# Patient Record
Sex: Female | Born: 1935 | Race: White | Hispanic: No | Smoking: Never smoker
Health system: Southern US, Community
[De-identification: ages and names within clinical notes are randomized; demographics above are authoritative.]

## PROBLEM LIST (undated history)

## (undated) DIAGNOSIS — N2889 Other specified disorders of kidney and ureter: Secondary | ICD-10-CM

## (undated) DIAGNOSIS — R413 Other amnesia: Secondary | ICD-10-CM

## (undated) DIAGNOSIS — H269 Unspecified cataract: Secondary | ICD-10-CM

## (undated) DIAGNOSIS — H6590 Unspecified nonsuppurative otitis media, unspecified ear: Secondary | ICD-10-CM

## (undated) DIAGNOSIS — I739 Peripheral vascular disease, unspecified: Secondary | ICD-10-CM

## (undated) DIAGNOSIS — M1990 Unspecified osteoarthritis, unspecified site: Secondary | ICD-10-CM

## (undated) DIAGNOSIS — E119 Type 2 diabetes mellitus without complications: Secondary | ICD-10-CM

## (undated) DIAGNOSIS — K769 Liver disease, unspecified: Secondary | ICD-10-CM

## (undated) DIAGNOSIS — E785 Hyperlipidemia, unspecified: Secondary | ICD-10-CM

## (undated) DIAGNOSIS — H9110 Presbycusis, unspecified ear: Secondary | ICD-10-CM

## (undated) DIAGNOSIS — R634 Abnormal weight loss: Secondary | ICD-10-CM

## (undated) DIAGNOSIS — R066 Hiccough: Secondary | ICD-10-CM

## (undated) DIAGNOSIS — H409 Unspecified glaucoma: Secondary | ICD-10-CM

## (undated) DIAGNOSIS — E663 Overweight: Secondary | ICD-10-CM

## (undated) DIAGNOSIS — I10 Essential (primary) hypertension: Secondary | ICD-10-CM

## (undated) HISTORY — DX: Unspecified nonsuppurative otitis media, unspecified ear: H65.90

## (undated) HISTORY — PX: CHOLECYSTECTOMY: SHX55

## (undated) HISTORY — DX: Overweight: E66.3

## (undated) HISTORY — DX: Hiccough: R06.6

## (undated) HISTORY — DX: Abnormal weight loss: R63.4

## (undated) HISTORY — DX: Other amnesia: R41.3

## (undated) HISTORY — DX: Presbycusis, unspecified ear: H91.10

## (undated) HISTORY — DX: Essential (primary) hypertension: I10

## (undated) HISTORY — DX: Liver disease, unspecified: K76.9

## (undated) HISTORY — DX: Other specified disorders of kidney and ureter: N28.89

## (undated) HISTORY — DX: Unspecified osteoarthritis, unspecified site: M19.90

## (undated) HISTORY — PX: BREAST SURGERY: SHX581

## (undated) HISTORY — DX: Unspecified cataract: H26.9

## (undated) HISTORY — DX: Unspecified glaucoma: H40.9

## (undated) HISTORY — PX: CARPAL TUNNEL RELEASE: SHX101

## (undated) HISTORY — DX: Peripheral vascular disease, unspecified: I73.9

## (undated) HISTORY — DX: Type 2 diabetes mellitus without complications: E11.9

## (undated) HISTORY — DX: Hyperlipidemia, unspecified: E78.5

---

## 1998-11-15 HISTORY — PX: TOTAL NEPHRECTOMY: SHX415

## 2011-05-03 ENCOUNTER — Ambulatory Visit: Payer: Self-pay | Admitting: Internal Medicine

## 2011-05-03 ENCOUNTER — Ambulatory Visit (INDEPENDENT_AMBULATORY_CARE_PROVIDER_SITE_OTHER): Payer: Medicare Other | Admitting: Family Medicine

## 2011-05-03 ENCOUNTER — Encounter: Payer: Self-pay | Admitting: Family Medicine

## 2011-05-03 DIAGNOSIS — M129 Arthropathy, unspecified: Secondary | ICD-10-CM

## 2011-05-03 DIAGNOSIS — E663 Overweight: Secondary | ICD-10-CM

## 2011-05-03 DIAGNOSIS — E119 Type 2 diabetes mellitus without complications: Secondary | ICD-10-CM | POA: Insufficient documentation

## 2011-05-03 DIAGNOSIS — N2889 Other specified disorders of kidney and ureter: Secondary | ICD-10-CM

## 2011-05-03 DIAGNOSIS — I10 Essential (primary) hypertension: Secondary | ICD-10-CM

## 2011-05-03 DIAGNOSIS — C649 Malignant neoplasm of unspecified kidney, except renal pelvis: Secondary | ICD-10-CM | POA: Insufficient documentation

## 2011-05-03 DIAGNOSIS — R634 Abnormal weight loss: Secondary | ICD-10-CM | POA: Insufficient documentation

## 2011-05-03 DIAGNOSIS — N289 Disorder of kidney and ureter, unspecified: Secondary | ICD-10-CM

## 2011-05-03 DIAGNOSIS — H9110 Presbycusis, unspecified ear: Secondary | ICD-10-CM

## 2011-05-03 DIAGNOSIS — M1990 Unspecified osteoarthritis, unspecified site: Secondary | ICD-10-CM

## 2011-05-03 DIAGNOSIS — R413 Other amnesia: Secondary | ICD-10-CM

## 2011-05-03 HISTORY — DX: Overweight: E66.3

## 2011-05-03 HISTORY — DX: Essential (primary) hypertension: I10

## 2011-05-03 HISTORY — DX: Unspecified osteoarthritis, unspecified site: M19.90

## 2011-05-03 HISTORY — DX: Presbycusis, unspecified ear: H91.10

## 2011-05-03 HISTORY — DX: Abnormal weight loss: R63.4

## 2011-05-03 HISTORY — DX: Other specified disorders of kidney and ureter: N28.89

## 2011-05-03 MED ORDER — LISINOPRIL 5 MG PO TABS: 5.0000 mg | ORAL_TABLET | Freq: Every day | ORAL | Status: DC

## 2011-05-03 NOTE — Patient Instructions (Addendum)

## 2011-05-03 NOTE — Assessment & Plan Note (Addendum)
Neck, back and hands are most notable. Is not interested in chronic meds but may use Aspercreme and Tylenol when necessary and report worsening symptoms.

## 2011-05-05 ENCOUNTER — Encounter: Payer: Self-pay | Admitting: Family Medicine

## 2011-05-05 DIAGNOSIS — R413 Other amnesia: Secondary | ICD-10-CM | POA: Insufficient documentation

## 2011-05-05 HISTORY — DX: Other amnesia: R41.3

## 2011-05-05 NOTE — Assessment & Plan Note (Signed)
Well-controlled today's visit on just 5 mg of lisinopril so we'll continue her on her amlodipine, lisinopril at 5 mg and her atenolol 100 mg daily.

## 2011-05-05 NOTE — Assessment & Plan Note (Signed)
Patient has no concerning symptoms and has never needed any medications. Will need a hemoglobin A1c drawn up with next blood draw to further evaluate. Encouraged to avoid a car.

## 2011-05-05 NOTE — Progress Notes (Signed)
RAYHANA SLIDER [id number] 1927-09-08 05/05/2011      Progress Note New Patient  Subjective  Chief Complaint  Chief Complaint  Patient presents with  . Establish Care    HPI  Patient is an 77 year old Caucasian female in today for new patient appointment. She was referred to Dr. Cathie Hoops by her chiropractor after her previous primary care doctor Dr. Neldon Newport. She feels well and offers no acute complaints today does need to establish care. He does have new hearing aids and is having some difficulty still with her hearing but it is slightly improved. She was widowed in September 2011 and has been having some trouble with stress and difficulty with short-term memory as a result. She does believe is situational and she continues to live at home and maintain her ADLs well. She has been diagnosed with diabetes in the past but has never had to take any medications. She does take frequent cinnamon in her diet does believe that's helping control her sugars. She reports her pain sugars are often 90-110. No recent febrile illness, headache, chest pain, palpitations, shortness of breath, GI or GU complaints. Her blood pressure is well-controlled on a combination of amlodipine lisinopril it is 5 mg and atenolol daily  Past Medical History  Diagnosis Date  . HTN (hypertension) 05/03/2011  . Diabetes mellitus 05/03/2011  . Overweight 05/03/2011  . Hearing loss of aging 05/03/2011  . Renal mass, left 05/03/2011  . Arthritis 05/03/2011  . Memory loss of unknown cause 05/05/2011    Past Surgical History  Procedure Date  . Total nephrectomy 2000    left  . Carpal tunnel release     right  . Cholecystectomy   . Breast surgery     biopsy, benign    Family History  Problem Relation Age of Onset  . Hypertension Mother   . Diabetes Sister     type 1  . Diabetes Brother   . Hypertension Brother   . Kidney disease Brother   . Diabetes Maternal Grandfather   . Heart disease Paternal Grandfather   .  Heart disease Brother   . Stroke Brother     History   Social History  . Marital Status: Widowed    Spouse Name: N/A    Number of Children: N/A  . Years of Education: N/A   Occupational History  . Not on file.   Social History Main Topics  . Smoking status: Never Smoker   . Smokeless tobacco: Not on file  . Alcohol Use: No  . Drug Use: No  . Sexually Active:    Other Topics Concern  . Not on file   Social History Narrative  . No narrative on file    No current outpatient prescriptions on file prior to visit.    Allergies  Allergen Reactions  . Shrimp (Shellfish Allergy)     Unknown allergic reaction  . Sulfites     Unknown reaction    Review of Systems  Review of Systems  Constitutional: Negative for fever, chills and malaise/fatigue.  HENT: Positive for hearing loss. Negative for ear pain, nosebleeds, congestion, tinnitus and ear discharge.   Eyes: Negative for discharge.  Respiratory: Negative for cough, sputum production, shortness of breath and wheezing.   Cardiovascular: Negative for chest pain, palpitations and leg swelling.  Gastrointestinal: Negative for heartburn, nausea, vomiting, abdominal pain, diarrhea, constipation and blood in stool.  Genitourinary: Negative for dysuria, urgency, frequency and hematuria.  Musculoskeletal: Negative for myalgias, back pain and falls.  Skin: Negative for rash.  Neurological: Negative for dizziness, tremors, sensory change, focal weakness, loss of consciousness, weakness and headaches.  Endo/Heme/Allergies: Negative for polydipsia. Does not bruise/bleed easily.  Psychiatric/Behavioral: Positive for depression and memory loss. Negative for suicidal ideas. The patient is not nervous/anxious and does not have insomnia.     Objective  BP 130/70  Pulse 70  Temp(Src) 98 F (36.7 C) (Oral)  Resp 18  Ht 4' 10.5" (1.486 m)  Wt 139 lb (63.05 kg)  BMI 28.56 kg/m2  SpO2 97%  Physical Exam  Physical Exam    Constitutional: She is oriented to person, place, and time and well-developed, well-nourished, and in no distress. No distress.  HENT:  Head: Normocephalic and atraumatic.  Right Ear: External ear normal.  Left Ear: External ear normal.  Nose: Nose normal.  Mouth/Throat: Oropharynx is clear and moist. No oropharyngeal exudate.  Eyes: Conjunctivae are normal. Pupils are equal, round, and reactive to light. Right eye exhibits no discharge. Left eye exhibits no discharge. No scleral icterus.  Neck: Normal range of motion. Neck supple. No thyromegaly present.  Cardiovascular: Normal rate, regular rhythm, normal heart sounds and intact distal pulses.   Pulmonary/Chest: Effort normal and breath sounds normal. No respiratory distress. She has no wheezes. She has no rales.  Abdominal: Soft. Bowel sounds are normal. She exhibits no distension and no mass. There is no tenderness.  Musculoskeletal: Normal range of motion. She exhibits no edema and no tenderness.  Lymphadenopathy:    She has no cervical adenopathy.  Neurological: She is alert and oriented to person, place, and time. She has normal reflexes. No cranial nerve deficit. Coordination normal.  Skin: Skin is warm and dry. No rash noted. She is not diaphoretic.  Psychiatric: Mood, memory and affect normal.       Assessment & Plan  Arthritis Neck, back and hands are most notable. Is not interested in chronic meds but may use Aspercreme and Tylenol when necessary and report worsening symptoms.  Diabetes mellitus Patient has no concerning symptoms and has never needed any medications. Will need a hemoglobin A1c drawn up with next blood draw to further evaluate. Encouraged to avoid a car.  HTN (hypertension) Well-controlled today's visit on just 5 mg of lisinopril so we'll continue her on her amlodipine, lisinopril at 5 mg and her atenolol 100 mg daily.  Hearing loss of aging Has gotten new hearing aides recently but continues to have  some difficulties  Renal mass, left We will have her renal functions checked and obtain old records to further evaluate her situation  Memory loss of unknown cause Patient has been widowed this year, notes some difficulty with her memory lately but acknowledges she has been very overwhelmed by the whole process. Is still living at home and able to take care of her ADLs. We will have her come back for reassessment.

## 2011-05-05 NOTE — Assessment & Plan Note (Signed)
We will have her renal functions checked and obtain old records to further evaluate her situation

## 2011-05-05 NOTE — Assessment & Plan Note (Signed)
Patient has been widowed this year, notes some difficulty with her memory lately but acknowledges she has been very overwhelmed by the whole process. Is still living at home and able to take care of her ADLs. We will have her come back for reassessment.

## 2011-05-05 NOTE — Assessment & Plan Note (Signed)
Has gotten new hearing aides recently but continues to have some difficulties

## 2011-05-28 ENCOUNTER — Telehealth: Payer: Self-pay | Admitting: Family Medicine

## 2011-05-28 NOTE — Telephone Encounter (Signed)
Forwarded to Dr. Abner Greenspan for review. No upcoming appointments with Cardiology;however, another set of these records were copied and are at the [facility] front desk. Call ext [phone] if these copies are needed.

## 2011-06-29 ENCOUNTER — Telehealth: Payer: Self-pay | Admitting: Family Medicine

## 2011-06-29 NOTE — Telephone Encounter (Signed)
Patient wants to know if you have all the records you need from Dr Brendolyn Patty at [facility], she requested them earlier this year

## 2011-06-30 NOTE — Telephone Encounter (Signed)
Have reviewed the chart and do not see any records from Dr Brendolyn Patty. Can you confirm? If no can you have the front desk request them again? They may be sitting at [facility] but I do not know

## 2011-07-01 NOTE — Telephone Encounter (Addendum)
Records have been scanned into EPIC.  Records are under media, 2nd item, "7/97-5/12".  You have to turn OFF "default filter" to see these records.

## 2011-07-01 NOTE — Telephone Encounter (Signed)
Please notify patient we have gotten her records

## 2011-07-01 NOTE — Telephone Encounter (Signed)
19 minutes spent with patient.   1-advised pt we have records from [facility] and [facility].  Pt wants [place] to have last kidney function test from 4/12 performed at [facility].  I advised pt we have many records from them and we would review them. 2-pt is overdue for follow up.  Scheduled pt for an office visit with Dr. Abner Greenspan for 07/20/11.  Pt will come the week before for labs.  I will call pt after Dr. Abner Greenspan review if she needs to be fasting. Dr. Abner Greenspan, please advise which labs patient needs at this visit.  Per 05/03/11 note she needs A1C.  Does she need anything else? 3-Several minutes spent with pt explaining that if she wishes to continue seeing Dr. Abner Greenspan she will need to be seen at the [facility] office.  Dr. Abner Greenspan saw this patient at the [facility] office while filling in for Dr. Artist Pais.  Pt is agreeable with this.  It took several minutes for the patient to understand what office she needed to come to.  I advised pt I would mail her a map to our office and a printout of her upcoming appointment.  Pt aware Dr. Abner Greenspan out of the office until Monday. Dr. Abner Greenspan, please advise the highlighted item above.

## 2011-07-01 NOTE — Telephone Encounter (Signed)
I have attempted to contact this patient by phone with the following results: left message to return my call on answering machine (home).  

## 2011-07-04 NOTE — Telephone Encounter (Signed)
It will have been 4 months since her last blood work would do a hgba1c, flp, liver, renal, cbc

## 2011-07-05 NOTE — Telephone Encounter (Signed)
Added to appointment.

## 2011-07-13 ENCOUNTER — Other Ambulatory Visit (INDEPENDENT_AMBULATORY_CARE_PROVIDER_SITE_OTHER): Payer: Medicare Other

## 2011-07-13 DIAGNOSIS — E119 Type 2 diabetes mellitus without complications: Secondary | ICD-10-CM

## 2011-07-13 DIAGNOSIS — E785 Hyperlipidemia, unspecified: Secondary | ICD-10-CM

## 2011-07-13 DIAGNOSIS — I10 Essential (primary) hypertension: Secondary | ICD-10-CM

## 2011-07-13 LAB — CBC WITH DIFFERENTIAL/PLATELET
Basophils Relative: 0.6 % (ref 0.0–3.0)
Eosinophils Relative: 1.4 % (ref 0.0–5.0)
Hemoglobin: 14.1 g/dL (ref 12.0–15.0)
Lymphocytes Relative: 17.7 % (ref 12.0–46.0)
Monocytes Relative: 6.9 % (ref 3.0–12.0)
Neutro Abs: 7.3 10*3/uL (ref 1.4–7.7)
RBC: 4.68 Mil/uL (ref 3.87–5.11)
WBC: 9.9 10*3/uL (ref 4.5–10.5)

## 2011-07-13 LAB — RENAL FUNCTION PANEL
Albumin: 4 g/dL (ref 3.5–5.2)
BUN: 12 mg/dL (ref 6–23)
CO2: 29 mEq/L (ref 19–32)
Chloride: 104 mEq/L (ref 96–112)
Potassium: 4.4 mEq/L (ref 3.5–5.1)

## 2011-07-13 LAB — LIPID PANEL
Total CHOL/HDL Ratio: 4
VLDL: 18.2 mg/dL (ref 0.0–40.0)

## 2011-07-13 LAB — LDL CHOLESTEROL, DIRECT: Direct LDL: 166.9 mg/dL

## 2011-07-13 LAB — HEPATIC FUNCTION PANEL
AST: 20 U/L (ref 0–37)
Albumin: 4 g/dL (ref 3.5–5.2)
Alkaline Phosphatase: 67 U/L (ref 39–117)
Bilirubin, Direct: 0 mg/dL (ref 0.0–0.3)
Total Bilirubin: 0.4 mg/dL (ref 0.3–1.2)

## 2011-07-20 ENCOUNTER — Encounter: Payer: Self-pay | Admitting: Family Medicine

## 2011-07-20 ENCOUNTER — Ambulatory Visit (INDEPENDENT_AMBULATORY_CARE_PROVIDER_SITE_OTHER): Payer: Medicare Other | Admitting: Family Medicine

## 2011-07-20 DIAGNOSIS — I10 Essential (primary) hypertension: Secondary | ICD-10-CM

## 2011-07-20 DIAGNOSIS — E785 Hyperlipidemia, unspecified: Secondary | ICD-10-CM

## 2011-07-20 DIAGNOSIS — E119 Type 2 diabetes mellitus without complications: Secondary | ICD-10-CM

## 2011-07-20 HISTORY — DX: Hyperlipidemia, unspecified: E78.5

## 2011-07-20 LAB — NMR LIPOPROFILE WITHOUT LIPIDS: Small LDL Particle Number: 154 nmol/L (ref <=527)

## 2011-07-20 NOTE — Patient Instructions (Signed)
Hypercholesterolemia High Blood Cholesterol Cholesterol is a white, waxy, fat-like protein needed by your body in small amounts. The liver makes all the cholesterol you need. It is carried from the liver by the blood through the blood vessels. Deposits (plaque) may build up on blood vessel walls. This makes the arteries narrower and stiffer. Plaque increases the risk for heart attack and stroke. You cannot feel your cholesterol level even if it is very high. The only way to know is by a blood test to check your lipid (fats) levels. Once you know your cholesterol levels, you should keep a record of the test results. Work with your caregiver to to keep your levels in the desired range. WHAT THE RESULTS MEAN:  Total cholesterol is a rough measure of all the cholesterol in your blood.   LDL is the so-called bad cholesterol. This is the type that deposits cholesterol in the walls of the arteries. You want this level to be low.   HDL is the good cholesterol because it cleans the arteries and carries the LDL away. You want this level to be high.   Triglycerides are fat that the body can either burn for energy or store. High levels are closely linked to heart disease.  DESIRED LEVELS:  Total cholesterol below 200.   LDL below 100 for people at risk, below 70 for very high risk.   HDL above 50 is good, above 60 is best.   Triglycerides below 150.  HOW TO LOWER YOUR CHOLESTEROL:  Diet.   Choose fish or white meat chicken and [place], roasted or baked. Limit fatty cuts of red meat, fried foods, and processed meats, such as sausage and lunch meat.   Eat lots of fresh fruits and vegetables. Choose whole grains, beans, pasta, potatoes and cereals.   Use only small amounts of olive, corn or canola oils. Avoid butter, mayonnaise, shortening or palm kernel oils. Avoid foods with trans-fats.   Use skim/nonfat milk and low-fat/nonfat yogurt and cheeses. Avoid whole milk, cream, ice cream, egg yolks and  cheeses. Healthy desserts include angel food cake, gingersnaps, animal crackers, hard candy, popsicles, and low-fat/nonfat frozen yogurt. Avoid pastries, cakes, pies and cookies.   Exercise.   A regular program helps decrease LDL and raises HDL.   Helps with weight control.   Do things that increase your activity level like gardening, walking, or taking the stairs.   Medication.   May be prescribed by your caregiver to help lowering cholesterol and the risk for heart disease.   You may need medicine even if your levels are normal if you have several risk factors.  HOME CARE INSTRUCTIONS  Follow your diet and exercise programs as suggested by your caregiver.   Take medications as directed.   Have blood work done when your caregiver feels it is necessary.  MAKE SURE YOU:   Understand these instructions.   Will watch your condition.   Will get help right away if you are not doing well or get worse.  Document Released: 11/01/2005 Document Re-Released: 10/14/2008 [facility] Patient Information 2011 [place], [place].  Consider Fish oil caps by [employer], called MegaRed 1 cap daily or increase your fish oil to 2 tsp daily

## 2011-07-20 NOTE — Assessment & Plan Note (Signed)
Patient reports her bp generally sits in the 130s to 140s when she checks it at home. Asymptomatic, she is encouraged to minimize sodium and we will recheck at next visit. Continue Lisinopril at 5 mg daily

## 2011-07-20 NOTE — Progress Notes (Signed)
Kathryn Collins [id number] [birth year] 07/20/2011      Progress Note-Follow Up  Subjective  Chief Complaint  Chief Complaint  Patient presents with  . Follow-up    1 month follow up    HPI  Patient is an 77 year old Caucasian female in today for followup on recent labs and her blood pressure. Overall she reports she is doing well. She is monitoring her blood pressure at home and generally seen systolic numbers in the 130s and 140s. She denied headaches, chest pain, palpitations. She notes occasionally her blood pressure is up but it's usually when she's been running and when she rests it improves. She denies any recent illness, fevers, chills, chest pain, shortness of breath, GI or GU complaints. She checks her sugars regularly and these numbers generally in the 90-110 range. She has trouble sleeping intermittently but she reports which he takes vitamin B12 or zinc she feels helps her to fall sleep. She is tolerating the addition of lisinopril and denies any cough  Past Medical History  Diagnosis Date  . HTN (hypertension) 05/03/2011  . Diabetes mellitus 05/03/2011  . Overweight 05/03/2011  . Hearing loss of aging 05/03/2011  . Renal mass, left 05/03/2011  . Arthritis 05/03/2011  . Memory loss of unknown cause 05/05/2011  . Hyperlipidemia 07/20/2011    Past Surgical History  Procedure Date  . Total nephrectomy 2000    left  . Carpal tunnel release     right  . Cholecystectomy   . Breast surgery     biopsy, benign    Family History  Problem Relation Age of Onset  . Hypertension Mother   . Diabetes Sister     type 1  . Diabetes Brother   . Hypertension Brother   . Kidney disease Brother   . Diabetes Maternal Grandfather   . Heart disease Paternal Grandfather   . Heart disease Brother   . Stroke Brother     History   Social History  . Marital Status: Widowed    Spouse Name: N/A    Number of Children: N/A  . Years of Education: N/A   Occupational History  . Not on  file.   Social History Main Topics  . Smoking status: Never Smoker   . Smokeless tobacco: Not on file  . Alcohol Use: No  . Drug Use: No  . Sexually Active:    Other Topics Concern  . Not on file   Social History Narrative  . No narrative on file    Current Outpatient Prescriptions on File Prior to Visit  Medication Sig Dispense Refill  . amLODipine (NORVASC) 5 MG tablet Take 5 mg by mouth daily.        Marland Kitchen atenolol (TENORMIN) 100 MG tablet Take 100 mg by mouth daily.        . Edetic Acid (EDTA) POWD 625 mg 2 (two) times daily.        Marland Kitchen GLUCOSAMINE SULFATE PO Take by mouth daily.        Marland Kitchen lisinopril (PRINIVIL,ZESTRIL) 5 MG tablet Take 1 tablet (5 mg total) by mouth daily.  30 tablet  3  . NON FORMULARY daily. Cataplex, Cataplex ACP, Betacol, Cataplex D, Cataplex B, A-F Betaford, Congaplex, okra pepsin         Allergies  Allergen Reactions  . Shrimp (Shellfish Allergy)     Unknown allergic reaction  . Sulfites     Unknown reaction    Review of Systems  Review of Systems  Constitutional: Negative  for fever and malaise/fatigue.  HENT: Negative for congestion.   Eyes: Negative for discharge.  Respiratory: Negative for shortness of breath.   Cardiovascular: Negative for chest pain, palpitations and leg swelling.  Gastrointestinal: Negative for nausea, abdominal pain, diarrhea and melena.  Genitourinary: Negative for dysuria.  Musculoskeletal: Negative for falls.  Skin: Negative for rash.  Neurological: Negative for loss of consciousness and headaches.  Endo/Heme/Allergies: Negative for polydipsia.  Psychiatric/Behavioral: Negative for depression and suicidal ideas. The patient is not nervous/anxious and does not have insomnia.     Objective  BP 148/74  Pulse 63  Temp(Src) 98 F (36.7 C) (Oral)  Ht 4' 10.5" (1.486 m)  Wt 139 lb 12.8 oz (63.413 kg)  BMI 28.72 kg/m2  SpO2 96%  Physical Exam  Physical Exam  Constitutional: She is well-developed, well-nourished,  and in no distress. No distress.  HENT:  Left Ear: External ear normal.  Mouth/Throat: No oropharyngeal exudate.  Eyes: EOM are normal. Left eye exhibits no discharge. No scleral icterus.  Neck: No JVD present. No tracheal deviation present.  Cardiovascular: Normal rate, regular rhythm, normal heart sounds and intact distal pulses.   Pulmonary/Chest: No respiratory distress. She has no rales.  Abdominal: She exhibits no distension and no mass. There is tenderness. There is no guarding.  Musculoskeletal: She exhibits no edema and no tenderness.  Lymphadenopathy:    She has no cervical adenopathy.  Skin: No rash noted. No erythema.    No results found for this basename: TSH   Lab Results  Component Value Date   WBC 9.9 07/13/2011   HGB 14.1 07/13/2011   HCT 42.7 07/13/2011   MCV 91.2 07/13/2011   PLT 271.0 07/13/2011   Lab Results  Component Value Date   CREATININE 0.9 07/13/2011   BUN 12 07/13/2011   NA 140 07/13/2011   K 4.4 07/13/2011   CL 104 07/13/2011   CO2 29 07/13/2011   Lab Results  Component Value Date   ALT 19 07/13/2011   AST 20 07/13/2011   ALKPHOS 67 07/13/2011   BILITOT 0.4 07/13/2011   Lab Results  Component Value Date   CHOL 230* 07/13/2011   Lab Results  Component Value Date   HDL 64.10 07/13/2011   No results found for this basename: LDLCALC   Lab Results  Component Value Date   TRIG 91.0 07/13/2011   Lab Results  Component Value Date   CHOLHDL 4 07/13/2011     Assessment & Plan  HTN (hypertension) Patient reports her bp generally sits in the 130s to 140s when she checks it at home. Asymptomatic, she is encouraged to minimize sodium and we will recheck at next visit. Continue Lisinopril at 5 mg daily  Diabetes mellitus hgba1c doing well, continue to minimize simple carbs and patient encouraged to continue monitoring her sugars qam and as needed,  Hyperlipidemia Encouraged to avoid trans fats and increase fatty acid supplements, reassess with next  lipid

## 2011-07-20 NOTE — Assessment & Plan Note (Signed)
hgba1c doing well, continue to minimize simple carbs and patient encouraged to continue monitoring her sugars qam and as needed,

## 2011-07-20 NOTE — Assessment & Plan Note (Signed)
Encouraged to avoid trans fats and increase fatty acid supplements, reassess with next lipid

## 2011-07-27 ENCOUNTER — Other Ambulatory Visit: Payer: Self-pay

## 2011-07-27 ENCOUNTER — Telehealth: Payer: Self-pay | Admitting: Family Medicine

## 2011-07-27 MED ORDER — GLUCOSE BLOOD VI STRP: ORAL_STRIP | Status: DC

## 2011-07-27 NOTE — Telephone Encounter (Signed)
Patient is out of test strips, please send Rx to [facility] in [place]

## 2011-10-12 ENCOUNTER — Other Ambulatory Visit (INDEPENDENT_AMBULATORY_CARE_PROVIDER_SITE_OTHER): Payer: Medicare Other

## 2011-10-12 DIAGNOSIS — I10 Essential (primary) hypertension: Secondary | ICD-10-CM

## 2011-10-12 DIAGNOSIS — E785 Hyperlipidemia, unspecified: Secondary | ICD-10-CM

## 2011-10-12 DIAGNOSIS — E119 Type 2 diabetes mellitus without complications: Secondary | ICD-10-CM

## 2011-10-12 LAB — RENAL FUNCTION PANEL
Albumin: 4 g/dL (ref 3.5–5.2)
BUN: 12 mg/dL (ref 6–23)
Chloride: 105 mEq/L (ref 96–112)
GFR: 53.9 mL/min — ABNORMAL LOW (ref >60.00)
Glucose, Bld: 101 mg/dL — ABNORMAL HIGH (ref 70–99)
Phosphorus: 4.1 mg/dL (ref 2.3–4.6)
Potassium: 4.3 mEq/L (ref 3.5–5.1)

## 2011-10-12 LAB — LDL CHOLESTEROL, DIRECT: Direct LDL: 158.7 mg/dL

## 2011-10-12 LAB — HEPATIC FUNCTION PANEL
AST: 24 U/L (ref 0–37)
Albumin: 4 g/dL (ref 3.5–5.2)
Alkaline Phosphatase: 75 U/L (ref 39–117)
Total Bilirubin: 0.7 mg/dL (ref 0.3–1.2)

## 2011-10-12 LAB — CBC
HCT: 43.4 % (ref 36.0–46.0)
MCV: 90.7 fl (ref 78.0–100.0)
RBC: 4.79 Mil/uL (ref 3.87–5.11)
RDW: 14.7 % — ABNORMAL HIGH (ref 11.5–14.6)
WBC: 10.1 10*3/uL (ref 4.5–10.5)

## 2011-10-12 LAB — LIPID PANEL
HDL: 75.4 mg/dL (ref >39.00)
Total CHOL/HDL Ratio: 3
VLDL: 16.2 mg/dL (ref 0.0–40.0)

## 2011-10-12 LAB — HEMOGLOBIN A1C: Hgb A1c MFr Bld: 6.4 % (ref 4.6–6.5)

## 2011-10-19 ENCOUNTER — Ambulatory Visit (INDEPENDENT_AMBULATORY_CARE_PROVIDER_SITE_OTHER): Payer: Medicare Other | Admitting: Family Medicine

## 2011-10-19 ENCOUNTER — Encounter: Payer: Self-pay | Admitting: Family Medicine

## 2011-10-19 VITALS — BP 132/74 | HR 69 | Ht 58.5 in | Wt 140.0 lb

## 2011-10-19 DIAGNOSIS — L989 Disorder of the skin and subcutaneous tissue, unspecified: Secondary | ICD-10-CM

## 2011-10-19 DIAGNOSIS — I10 Essential (primary) hypertension: Secondary | ICD-10-CM

## 2011-10-19 DIAGNOSIS — E785 Hyperlipidemia, unspecified: Secondary | ICD-10-CM

## 2011-10-19 DIAGNOSIS — E119 Type 2 diabetes mellitus without complications: Secondary | ICD-10-CM

## 2011-10-19 NOTE — Progress Notes (Signed)
Kathryn Collins [id number] May 16, 1927 10/19/2011      Progress Note-Follow Up  Subjective  Chief Complaint  Chief Complaint  Patient presents with  . Follow-up    3 month follow up    HPI  Patient is a 77-year-old Caucasian female who is in today for followup. She reports blood sugars up in the 90s to 110. Denies polyuria or polydipsia. Does have a small scab at the base of her neck posteriorly she thinks it's been present a couple months no bleeding but it does itch at times. She's had some intermittent difficulties with lower extremity pain and fatigue. She denies any recent febrile illness, GI or GU complaints.  Past Medical History  Diagnosis Date  . HTN (hypertension) 05/03/2011  . Diabetes mellitus 05/03/2011  . Overweight 05/03/2011  . Hearing loss of aging 05/03/2011  . Renal mass, left 05/03/2011  . Arthritis 05/03/2011  . Memory loss of unknown cause 05/05/2011  . Hyperlipidemia 07/20/2011    Past Surgical History  Procedure Date  . Total nephrectomy 2000    left  . Carpal tunnel release     right  . Cholecystectomy   . Breast surgery     biopsy, benign    Family History  Problem Relation Age of Onset  . Hypertension Mother   . Diabetes Sister     type 1  . Diabetes Brother   . Hypertension Brother   . Kidney disease Brother   . Diabetes Maternal Grandfather   . Heart disease Paternal Grandfather   . Heart disease Brother   . Stroke Brother     History   Social History  . Marital Status: Widowed    Spouse Name: N/A    Number of Children: N/A  . Years of Education: N/A   Occupational History  . Not on file.   Social History Main Topics  . Smoking status: Never Smoker   . Smokeless tobacco: Never Used  . Alcohol Use: No  . Drug Use: No  . Sexually Active: Not on file   Other Topics Concern  . Not on file   Social History Narrative  . No narrative on file    Current Outpatient Prescriptions on File Prior to Visit  Medication Sig Dispense  Refill  . amLODipine (NORVASC) 5 MG tablet Take 5 mg by mouth daily.        Marland Kitchen atenolol (TENORMIN) 100 MG tablet Take 100 mg by mouth daily.        . Edetic Acid (EDTA) POWD 625 mg 2 (two) times daily.        Marland Kitchen GLUCOSAMINE SULFATE PO Take by mouth daily.        Marland Kitchen glucose blood (ONE TOUCH ULTRA TEST) test strip Use as instructed up to qid  Diagnosis code 250.00  100 each  1  . lisinopril (PRINIVIL,ZESTRIL) 5 MG tablet Take 1 tablet (5 mg total) by mouth daily.  30 tablet  3  . NON FORMULARY daily. Cataplex, Cataplex ACP, Betacol, Cataplex D, Cataplex B, A-F Betacol, Congaplex, okra pepsin, Catalyn, A-F Betafood        Allergies  Allergen Reactions  . Shrimp (Shellfish Allergy)     Unknown allergic reaction  . Sulfites     Unknown reaction    Review of Systems  Review of Systems  Constitutional: Negative for fever and malaise/fatigue.  HENT: Negative for congestion.   Eyes: Negative for discharge.  Respiratory: Negative for shortness of breath.   Cardiovascular: Negative for chest  pain, palpitations and leg swelling.  Gastrointestinal: Negative for nausea, abdominal pain and diarrhea.  Genitourinary: Negative for dysuria.  Musculoskeletal: Negative for falls.       C/o some RLS symptoms a couple times a week. She finds that a Vitamin B12 lozenge often helps, if it is inadequate then she does a Zinc lozenge a couple times a week and then that helps  Skin: Positive for itching and rash.       Upper back at base of neck scaly and itchy  Neurological: Negative for loss of consciousness and headaches.  Endo/Heme/Allergies: Negative for polydipsia.  Psychiatric/Behavioral: Negative for depression and suicidal ideas. The patient is not nervous/anxious and does not have insomnia.     Objective  BP 132/74  Pulse 69  Ht 4' 10.5" (1.486 m)  Wt 140 lb (63.504 kg)  BMI 28.76 kg/m2  SpO2 98%  Physical Exam  Physical Exam  Constitutional: She is oriented to person, place, and time and  well-developed, well-nourished, and in no distress. No distress.  HENT:  Head: Normocephalic and atraumatic.  Eyes: Conjunctivae are normal.  Neck: Neck supple. No thyromegaly present.  Cardiovascular: Normal rate, regular rhythm and normal heart sounds.   No murmur heard. Pulmonary/Chest: Effort normal and breath sounds normal. She has no wheezes.  Abdominal: She exhibits no distension and no mass.  Musculoskeletal: She exhibits no edema.  Lymphadenopathy:    She has no cervical adenopathy.  Neurological: She is alert and oriented to person, place, and time.  Skin: Skin is warm and dry. No rash noted. She is not diaphoretic.  Psychiatric: Memory, affect and judgment normal.    Lab Results  Component Value Date   TSH 1.24 10/12/2011   Lab Results  Component Value Date   WBC 10.1 10/12/2011   HGB 14.5 10/12/2011   HCT 43.4 10/12/2011   MCV 90.7 10/12/2011   PLT 276.0 10/12/2011   Lab Results  Component Value Date   CREATININE 1.0 10/12/2011   BUN 12 10/12/2011   NA 139 10/12/2011   K 4.3 10/12/2011   CL 105 10/12/2011   CO2 26 10/12/2011   Lab Results  Component Value Date   ALT 19 10/12/2011   AST 24 10/12/2011   ALKPHOS 75 10/12/2011   BILITOT 0.7 10/12/2011   Lab Results  Component Value Date   CHOL 247* 10/12/2011   Lab Results  Component Value Date   HDL 75.40 10/12/2011   No results found for this basename: LDLCALC   Lab Results  Component Value Date   TRIG 81.0 10/12/2011   Lab Results  Component Value Date   CHOLHDL 3 10/12/2011     Assessment & Plan  Skin lesion of back Scaly and irritated frequently, present for months, patient agrees to follow with dermatology, referral made  HTN (hypertension) Well controlled today, no changes to meds  Diabetes mellitus hgba1c stable, no changes  Hyperlipidemia Patient will add fiber supplement, avoid trans fats and increase fish oil to 2 caps daily, patient hesitant to start prescription meds  will consider at next visit   2

## 2011-10-19 NOTE — Assessment & Plan Note (Signed)
hgba1c stable, no changes

## 2011-10-19 NOTE — Patient Instructions (Signed)

## 2011-10-19 NOTE — Assessment & Plan Note (Signed)
Patient will add fiber supplement, avoid trans fats and increase fish oil to 2 caps daily, patient hesitant to start prescription meds will consider at next visit

## 2011-10-19 NOTE — Assessment & Plan Note (Signed)
Well controlled today, no changes to meds. 

## 2011-10-19 NOTE — Assessment & Plan Note (Signed)
Scaly and irritated frequently, present for months, patient agrees to follow with dermatology, referral made

## 2012-02-09 ENCOUNTER — Other Ambulatory Visit (INDEPENDENT_AMBULATORY_CARE_PROVIDER_SITE_OTHER): Payer: Medicare Other

## 2012-02-09 DIAGNOSIS — I10 Essential (primary) hypertension: Secondary | ICD-10-CM

## 2012-02-09 DIAGNOSIS — E119 Type 2 diabetes mellitus without complications: Secondary | ICD-10-CM

## 2012-02-09 DIAGNOSIS — E785 Hyperlipidemia, unspecified: Secondary | ICD-10-CM

## 2012-02-09 LAB — HEPATIC FUNCTION PANEL
ALT: 20 U/L (ref 0–35)
AST: 22 U/L (ref 0–37)
Albumin: 3.9 g/dL (ref 3.5–5.2)
Alkaline Phosphatase: 64 U/L (ref 39–117)
Bilirubin, Direct: 0.1 mg/dL (ref 0.0–0.3)
Total Protein: 7.6 g/dL (ref 6.0–8.3)

## 2012-02-09 LAB — RENAL FUNCTION PANEL
Albumin: 3.9 g/dL (ref 3.5–5.2)
Calcium: 9.6 mg/dL (ref 8.4–10.5)
Chloride: 101 mEq/L (ref 96–112)
Glucose, Bld: 101 mg/dL — ABNORMAL HIGH (ref 70–99)
Potassium: 4.1 mEq/L (ref 3.5–5.1)
Sodium: 138 mEq/L (ref 135–145)

## 2012-02-09 LAB — CBC
HCT: 43.2 % (ref 36.0–46.0)
Hemoglobin: 14.3 g/dL (ref 12.0–15.0)
RBC: 4.72 Mil/uL (ref 3.87–5.11)
RDW: 14.3 % (ref 11.5–14.6)
WBC: 9.6 10*3/uL (ref 4.5–10.5)

## 2012-02-09 LAB — HEMOGLOBIN A1C: Hgb A1c MFr Bld: 6.3 % (ref 4.6–6.5)

## 2012-02-09 LAB — LIPID PANEL: HDL: 69.5 mg/dL (ref >39.00)

## 2012-02-10 ENCOUNTER — Other Ambulatory Visit: Payer: Medicare Other

## 2012-02-16 ENCOUNTER — Ambulatory Visit (INDEPENDENT_AMBULATORY_CARE_PROVIDER_SITE_OTHER): Payer: Medicare Other | Admitting: Family Medicine

## 2012-02-16 ENCOUNTER — Encounter: Payer: Self-pay | Admitting: Family Medicine

## 2012-02-16 VITALS — BP 160/74 | HR 66 | Temp 98.0°F | Ht 58.5 in | Wt 140.0 lb

## 2012-02-16 DIAGNOSIS — E785 Hyperlipidemia, unspecified: Secondary | ICD-10-CM

## 2012-02-16 DIAGNOSIS — H6590 Unspecified nonsuppurative otitis media, unspecified ear: Secondary | ICD-10-CM | POA: Insufficient documentation

## 2012-02-16 DIAGNOSIS — I10 Essential (primary) hypertension: Secondary | ICD-10-CM

## 2012-02-16 DIAGNOSIS — E119 Type 2 diabetes mellitus without complications: Secondary | ICD-10-CM

## 2012-02-16 HISTORY — DX: Unspecified nonsuppurative otitis media, unspecified ear: H65.90

## 2012-02-16 MED ORDER — AMLODIPINE BESYLATE 5 MG PO TABS: 5.0000 mg | ORAL_TABLET | Freq: Every day | ORAL | Status: DC

## 2012-02-16 MED ORDER — ATENOLOL 100 MG PO TABS: 100.0000 mg | ORAL_TABLET | ORAL | Status: DC

## 2012-02-16 MED ORDER — LISINOPRIL 5 MG PO TABS: 5.0000 mg | ORAL_TABLET | ORAL | Status: DC

## 2012-02-16 MED ORDER — ATENOLOL 100 MG PO TABS: 100.0000 mg | ORAL_TABLET | Freq: Every day | ORAL | Status: DC

## 2012-02-16 NOTE — Patient Instructions (Addendum)
Cholesterol Cholesterol is a white, waxy, fat-like protein needed by your body in small amounts. The liver makes all the cholesterol you need. It is carried from the liver by the blood through the blood vessels. Deposits (plaque) may build up on blood vessel walls. This makes the arteries narrower and stiffer. Plaque increases the risk for heart attack and stroke. You cannot feel your cholesterol level even if it is very high. The only way to know is by a blood test to check your lipid (fats) levels. Once you know your cholesterol levels, you should keep a record of the test results. Work with your caregiver to to keep your levels in the desired range. WHAT THE RESULTS MEAN:  Total cholesterol is a rough measure of all the cholesterol in your blood.   LDL is the so-called bad cholesterol. This is the type that deposits cholesterol in the walls of the arteries. You want this level to be low.   HDL is the good cholesterol because it cleans the arteries and carries the LDL away. You want this level to be high.   Triglycerides are fat that the body can either burn for energy or store. High levels are closely linked to heart disease.  DESIRED LEVELS:  Total cholesterol below 200.   LDL below 100 for people at risk, below 70 for very high risk.   HDL above 50 is good, above 60 is best.   Triglycerides below 150.  HOW TO LOWER YOUR CHOLESTEROL:  Diet.   Choose fish or white meat chicken and [place], roasted or baked. Limit fatty cuts of red meat, fried foods, and processed meats, such as sausage and lunch meat.   Eat lots of fresh fruits and vegetables. Choose whole grains, beans, pasta, potatoes and cereals.   Use only small amounts of olive, corn or canola oils. Avoid butter, mayonnaise, shortening or palm kernel oils. Avoid foods with trans-fats.   Use skim/nonfat milk and low-fat/nonfat yogurt and cheeses. Avoid whole milk, cream, ice cream, egg yolks and cheeses. Healthy desserts include  angel food cake, ginger snaps, animal crackers, hard candy, popsicles, and low-fat/nonfat frozen yogurt. Avoid pastries, cakes, pies and cookies.   Exercise.   A regular program helps decrease LDL and raises HDL.   Helps with weight control.   Do things that increase your activity level like gardening, walking, or taking the stairs.   Medication.   May be prescribed by your caregiver to help lowering cholesterol and the risk for heart disease.   You may need medicine even if your levels are normal if you have several risk factors.  HOME CARE INSTRUCTIONS   Follow your diet and exercise programs as suggested by your caregiver.   Take medications as directed.   Have blood work done when your caregiver feels it is necessary.  MAKE SURE YOU:   Understand these instructions.   Will watch your condition.   Will get help right away if you are not doing well or get worse.  Document Released: 07/27/2001 Document Revised: 10/21/2011 Document Reviewed: 01/17/2008 [facility] Patient Information 2012 [place], [place].     For cholesterol consider adding Niacin (slow release or enteric coated) 500mg  at bedtime and take a lowfat snack and 81 mg enteric coated aspirin 1/2 hour before.  Also start back on your fiber supplement and avoid trans fats/partially hydrogenated oils and increase fish oil to 2 tsp in am and 1 tsp in pm   For Hi Blood Pressure Move the Atenolol and Lisinopril  to morning and move Amlodipine to evening and/or bedtime  For the fluid in the ears, take your allergy pill every day, get some nasal saline (such as [place]) and flush both nostrils out twice daily and try and pop the ears open by holding the nose and gently blowing

## 2012-02-17 ENCOUNTER — Ambulatory Visit: Payer: Medicare Other | Admitting: Family Medicine

## 2012-02-21 ENCOUNTER — Other Ambulatory Visit: Payer: Self-pay | Admitting: Family Medicine

## 2012-02-21 MED ORDER — GLUCOSE BLOOD VI STRP: ORAL_STRIP | Status: DC

## 2012-02-22 NOTE — Assessment & Plan Note (Signed)
Encouraged to start fish oil and Niacin, avoid trans fats, she is very resistant to statins so we will continue to try and work with her

## 2012-02-22 NOTE — Assessment & Plan Note (Addendum)
Improved on repeat, did not take meds yet this am, will continue to monitor and encouraged to take meds prior to next visit.

## 2012-02-22 NOTE — Progress Notes (Signed)
Patient ID: Kathryn Collins, female   DOB: 04/30/1927, 78 y.o.   MRN: [id number] Kathryn Collins [id number] 11/17/1926 02/22/2012      Progress Note-Follow Up  Subjective  Chief Complaint  Chief Complaint  Patient presents with  . Follow-up    4 month follow up    HPI  Patient is an 78 year old Caucasian female who is in today for routine followup. Overall she feels well. She is noting blood sugars are ranging from 90-120. Denies polyuria or polydipsia. Did not take her blood pressure medications prior to coming in he tends to take all together one time later in the day. Does have some fullness noted in her ears but does not have any ear pain or tinnitus. Denies any recent congestion, chest pain, palpitations, fevers, GI or GU complaints at this time.  Past Medical History  Diagnosis Date  . HTN (hypertension) 05/03/2011  . Diabetes mellitus 05/03/2011  . Overweight 05/03/2011  . Hearing loss of aging 05/03/2011  . Renal mass, left 05/03/2011  . Arthritis 05/03/2011  . Memory loss of unknown cause 05/05/2011  . Hyperlipidemia 07/20/2011  . SOM (serous otitis media) 02/16/2012    Past Surgical History  Procedure Date  . Total nephrectomy 2000    left  . Carpal tunnel release     right  . Cholecystectomy   . Breast surgery     biopsy, benign    Family History  Problem Relation Age of Onset  . Hypertension Mother   . Diabetes Sister     type 1  . Diabetes Brother   . Hypertension Brother   . Kidney disease Brother   . Diabetes Maternal Grandfather   . Heart disease Paternal Grandfather   . Heart disease Brother   . Stroke Brother     History   Social History  . Marital Status: Widowed    Spouse Name: N/A    Number of Children: N/A  . Years of Education: N/A   Occupational History  . Not on file.   Social History Main Topics  . Smoking status: Never Smoker   . Smokeless tobacco: Never Used  . Alcohol Use: No  . Drug Use: No  . Sexually Active: Not on file     Other Topics Concern  . Not on file   Social History Narrative  . No narrative on file    Current Outpatient Prescriptions on File Prior to Visit  Medication Sig Dispense Refill  . amLODipine (NORVASC) 5 MG tablet Take 1 tablet (5 mg total) by mouth at bedtime.  90 tablet  3  . atenolol (TENORMIN) 100 MG tablet Take 1 tablet (100 mg total) by mouth every morning.  90 tablet  3  . Boswellia Serrata (BOSWELLIA PO) Take 1 tablet by mouth 3 (three) times daily with meals.       Marland Kitchen CALCIUM LACTATE PO Take 3 tablets by mouth at bedtime.        Marland Kitchen lisinopril (PRINIVIL,ZESTRIL) 5 MG tablet Take 1 tablet (5 mg total) by mouth every morning.  90 tablet  3  . NON FORMULARY daily. Cataplex, Cataplex ACP, Betacol, Cataplex D, Cataplex B, A-F Betacol, Congaplex, Catalyn, A-F Betafood      . Policosanol (LIPEX PO) Take 1 capsule by mouth 2 (two) times daily.          Allergies  Allergen Reactions  . Shrimp (Shellfish Allergy)     Unknown allergic reaction  . Sulfites     Unknown reaction  Review of Systems  Review of Systems  Constitutional: Negative for fever and malaise/fatigue.  HENT: Negative for congestion.   Eyes: Negative for discharge.  Respiratory: Negative for shortness of breath.   Cardiovascular: Negative for chest pain, palpitations and leg swelling.  Gastrointestinal: Negative for nausea, abdominal pain and diarrhea.  Genitourinary: Negative for dysuria.  Musculoskeletal: Negative for falls.  Skin: Negative for rash.  Neurological: Negative for loss of consciousness and headaches.  Endo/Heme/Allergies: Negative for polydipsia.  Psychiatric/Behavioral: Negative for depression and suicidal ideas. The patient is not nervous/anxious and does not have insomnia.     Objective  BP 160/74  Pulse 66  Temp(Src) 98 F (36.7 C) (Temporal)  Ht 4' 10.5" (1.486 m)  Wt 140 lb (63.504 kg)  BMI 28.76 kg/m2  SpO2 96%  Physical Exam  Physical Exam  Constitutional: She is  oriented to person, place, and time and well-developed, well-nourished, and in no distress. No distress.  HENT:  Head: Normocephalic and atraumatic.  Eyes: Conjunctivae are normal.  Neck: Neck supple. No thyromegaly present.  Cardiovascular: Normal rate, regular rhythm and normal heart sounds.   No murmur heard. Pulmonary/Chest: Effort normal and breath sounds normal. She has no wheezes.  Abdominal: She exhibits no distension and no mass.  Musculoskeletal: She exhibits no edema.  Lymphadenopathy:    She has no cervical adenopathy.  Neurological: She is alert and oriented to person, place, and time.  Skin: Skin is warm and dry. No rash noted. She is not diaphoretic.  Psychiatric: Memory, affect and judgment normal.    Lab Results  Component Value Date   TSH 1.46 02/09/2012   Lab Results  Component Value Date   WBC 9.6 02/09/2012   HGB 14.3 02/09/2012   HCT 43.2 02/09/2012   MCV 91.4 02/09/2012   PLT 262.0 02/09/2012   Lab Results  Component Value Date   CREATININE 0.9 02/09/2012   BUN 13 02/09/2012   NA 138 02/09/2012   K 4.1 02/09/2012   CL 101 02/09/2012   CO2 27 02/09/2012   Lab Results  Component Value Date   ALT 20 02/09/2012   AST 22 02/09/2012   ALKPHOS 64 02/09/2012   BILITOT 0.4 02/09/2012   Lab Results  Component Value Date   CHOL 222* 02/09/2012   Lab Results  Component Value Date   HDL 69.50 02/09/2012   No results found for this basename: LDLCALC   Lab Results  Component Value Date   TRIG 86.0 02/09/2012   Lab Results  Component Value Date   CHOLHDL 3 02/09/2012     Assessment & Plan  HTN (hypertension) Improved on repeat, did not take meds yet this am, will continue to monitor and encouraged to take meds prior to next visit.  Hyperlipidemia Encouraged to start fish oil and Niacin, avoid trans fats, she is very resistant to statins so we will continue to try and work with her  Diabetes mellitus Sugars have  Been good noted in the 90 to 120 range in  am, no change in therapy today, minimize simple carbs

## 2012-02-22 NOTE — Assessment & Plan Note (Signed)
Sugars have  Been good noted in the 90 to 120 range in am, no change in therapy today, minimize simple carbs

## 2012-02-23 ENCOUNTER — Other Ambulatory Visit: Payer: Self-pay

## 2012-02-23 MED ORDER — GLUCOSE BLOOD VI STRP: ORAL_STRIP | Status: DC

## 2012-04-14 ENCOUNTER — Telehealth: Payer: Self-pay | Admitting: Emergency Medicine

## 2012-04-14 NOTE — Telephone Encounter (Signed)
Dr. Abner Greenspan, I'm forwarding this non-urgent dilemma to you since this is your patient and you can address it better than I can.-thx

## 2012-04-14 NOTE — Telephone Encounter (Signed)
Please advise 

## 2012-04-16 NOTE — Telephone Encounter (Signed)
Please write her a note address to the town of [place] saying it is not medically advisable for her to bring her garbage to the curb herself.

## 2012-04-18 NOTE — Telephone Encounter (Signed)
Patient informed that letter is done and states she is going to call us back with a address.

## 2012-04-19 NOTE — Telephone Encounter (Signed)
Patient called with the address: [employer] [address] [place] [id number].

## 2012-04-19 NOTE — Telephone Encounter (Signed)
Letter mailed

## 2012-06-13 ENCOUNTER — Other Ambulatory Visit (INDEPENDENT_AMBULATORY_CARE_PROVIDER_SITE_OTHER): Payer: Medicare Other

## 2012-06-13 DIAGNOSIS — I10 Essential (primary) hypertension: Secondary | ICD-10-CM

## 2012-06-13 DIAGNOSIS — E119 Type 2 diabetes mellitus without complications: Secondary | ICD-10-CM

## 2012-06-13 LAB — HEMOGLOBIN A1C: Hgb A1c MFr Bld: 6.1 % (ref 4.6–6.5)

## 2012-06-13 LAB — CBC
HCT: 42.3 % (ref 36.0–46.0)
MCV: 91.6 fl (ref 78.0–100.0)
RBC: 4.62 Mil/uL (ref 3.87–5.11)
RDW: 14.1 % (ref 11.5–14.6)
WBC: 10 10*3/uL (ref 4.5–10.5)

## 2012-06-13 LAB — HEPATIC FUNCTION PANEL
ALT: 18 U/L (ref 0–35)
AST: 22 U/L (ref 0–37)
Bilirubin, Direct: 0.1 mg/dL (ref 0.0–0.3)
Total Bilirubin: 0.6 mg/dL (ref 0.3–1.2)

## 2012-06-13 LAB — LIPID PANEL
Cholesterol: 179 mg/dL (ref 0–200)
LDL Cholesterol: 98 mg/dL (ref 0–99)
Triglycerides: 68 mg/dL (ref 0.0–149.0)
VLDL: 13.6 mg/dL (ref 0.0–40.0)

## 2012-06-13 LAB — RENAL FUNCTION PANEL
Albumin: 3.8 g/dL (ref 3.5–5.2)
BUN: 14 mg/dL (ref 6–23)
Calcium: 9.9 mg/dL (ref 8.4–10.5)
Chloride: 100 mEq/L (ref 96–112)
Glucose, Bld: 94 mg/dL (ref 70–99)
Phosphorus: 3.8 mg/dL (ref 2.3–4.6)

## 2012-06-13 LAB — TSH: TSH: 2.18 u[IU]/mL (ref 0.35–5.50)

## 2012-06-20 ENCOUNTER — Encounter: Payer: Self-pay | Admitting: Family Medicine

## 2012-06-20 ENCOUNTER — Ambulatory Visit (INDEPENDENT_AMBULATORY_CARE_PROVIDER_SITE_OTHER): Payer: Medicare Other | Admitting: Family Medicine

## 2012-06-20 VITALS — BP 167/76 | HR 63 | Temp 97.8°F | Ht 58.5 in | Wt 137.1 lb

## 2012-06-20 DIAGNOSIS — E119 Type 2 diabetes mellitus without complications: Secondary | ICD-10-CM | POA: Insufficient documentation

## 2012-06-20 DIAGNOSIS — I10 Essential (primary) hypertension: Secondary | ICD-10-CM

## 2012-06-20 DIAGNOSIS — E785 Hyperlipidemia, unspecified: Secondary | ICD-10-CM

## 2012-06-20 HISTORY — DX: Type 2 diabetes mellitus without complications: E11.9

## 2012-06-20 NOTE — Patient Instructions (Addendum)

## 2012-06-20 NOTE — Progress Notes (Signed)
Patient ID: Kathryn Collins, female   DOB: January 31, 1927, 78 y.o.   MRN: [id number] Kathryn Collins [id number] 28-Apr-1927 06/20/2012      Progress Note-Follow Up  Subjective  Chief Complaint  Chief Complaint  Patient presents with  . Follow-up    4 month    HPI  Patient is an 78 year old Caucasian female who is in today for routine followup. She is feeling very stressed and had to rush here today secondary to some plumbing problems at her house that required a plumber today. She feels that her blood pressure is up. She denies headaches, palpitations, chest pain. She reports overall she's been doing well. She's changed her diet and is eating less carbs and less fatty foods and is pleased with her blood work results today. She's had no recent illness, congestion, fevers or other acute concerns. No polyuria or polydipsia.  Past Medical History  Diagnosis Date  . HTN (hypertension) 05/03/2011  . Diabetes mellitus 05/03/2011  . Overweight 05/03/2011  . Hearing loss of aging 05/03/2011  . Renal mass, left 05/03/2011  . Arthritis 05/03/2011  . Memory loss of unknown cause 05/05/2011  . Hyperlipidemia 07/20/2011  . SOM (serous otitis media) 02/16/2012  . Diabetes mellitus 06/20/2012    Past Surgical History  Procedure Date  . Total nephrectomy 2000    left  . Carpal tunnel release     right  . Cholecystectomy   . Breast surgery     biopsy, benign    Family History  Problem Relation Age of Onset  . Hypertension Mother   . Diabetes Sister     type 1  . Diabetes Brother   . Hypertension Brother   . Kidney disease Brother   . Diabetes Maternal Grandfather   . Heart disease Paternal Grandfather   . Heart disease Brother   . Stroke Brother     History   Social History  . Marital Status: Widowed    Spouse Name: N/A    Number of Children: N/A  . Years of Education: N/A   Occupational History  . Not on file.   Social History Main Topics  . Smoking status: Never Smoker   .  Smokeless tobacco: Never Used  . Alcohol Use: No  . Drug Use: No  . Sexually Active: Not on file   Other Topics Concern  . Not on file   Social History Narrative  . No narrative on file    Current Outpatient Prescriptions on File Prior to Visit  Medication Sig Dispense Refill  . amLODipine (NORVASC) 5 MG tablet Take 1 tablet (5 mg total) by mouth at bedtime.  90 tablet  3  . atenolol (TENORMIN) 100 MG tablet Take 1 tablet (100 mg total) by mouth every morning.  90 tablet  3  . Bioflavonoid Products (ESTER C PO) Take 250 mg by mouth 2 (two) times daily. During the winter months      . Boswellia Serrata (BOSWELLIA PO) Take 1 tablet by mouth 3 (three) times daily with meals.       Marland Kitchen CALCIUM LACTATE PO Take 3 tablets by mouth at bedtime.        . Cyanocobalamin (VITAMIN B-12 CR PO) Take 1 tablet by mouth daily.      Marland Kitchen glucose blood (ONE TOUCH ULTRA TEST) test strip Use as instructed up to qid  Diagnosis code 250.00  100 each  1  . lisinopril (PRINIVIL,ZESTRIL) 5 MG tablet Take 1 tablet (5 mg total) by mouth  every morning.  90 tablet  3  . NON FORMULARY daily. Cataplex, Cataplex ACP, Betacol, Cataplex D, Cataplex B, A-F Betacol, Congaplex, Catalyn, A-F Betafood      . Policosanol (LIPEX PO) Take 1 capsule by mouth 2 (two) times daily.        . Zinc 10 MG LOZG Use as directed in the mouth or throat.        Allergies  Allergen Reactions  . Shrimp (Shellfish Allergy)     Unknown allergic reaction  . Sulfites     Unknown reaction    Review of Systems  Review of Systems  Constitutional: Negative for fever and malaise/fatigue.  HENT: Negative for congestion.   Eyes: Negative for discharge.  Respiratory: Negative for shortness of breath.   Cardiovascular: Negative for chest pain, palpitations and leg swelling.  Gastrointestinal: Negative for nausea, abdominal pain and diarrhea.  Genitourinary: Negative for dysuria.  Musculoskeletal: Negative for falls.  Skin: Negative for rash.    Neurological: Negative for loss of consciousness and headaches.  Endo/Heme/Allergies: Negative for polydipsia.  Psychiatric/Behavioral: Negative for depression and suicidal ideas. The patient is nervous/anxious. The patient does not have insomnia.     Objective  BP 167/76  Pulse 63  Temp 97.8 F (36.6 C) (Temporal)  Ht 4' 10.5" (1.486 m)  Wt 137 lb 1.9 oz (62.197 kg)  BMI 28.17 kg/m2  SpO2 95%  Physical Exam  Physical Exam  Constitutional: She is oriented to person, place, and time and well-developed, well-nourished, and in no distress. No distress.  HENT:  Head: Normocephalic and atraumatic.  Eyes: Conjunctivae are normal.  Neck: Neck supple. No thyromegaly present.  Cardiovascular: Normal rate, regular rhythm and normal heart sounds.   No murmur heard. Pulmonary/Chest: Effort normal and breath sounds normal. She has no wheezes.  Abdominal: She exhibits no distension and no mass.  Musculoskeletal: She exhibits no edema.  Lymphadenopathy:    She has no cervical adenopathy.  Neurological: She is alert and oriented to person, place, and time.  Skin: Skin is warm and dry. No rash noted. She is not diaphoretic.  Psychiatric: Memory, affect and judgment normal.    Lab Results  Component Value Date   TSH 2.18 06/13/2012   Lab Results  Component Value Date   WBC 10.0 06/13/2012   HGB 14.2 06/13/2012   HCT 42.3 06/13/2012   MCV 91.6 06/13/2012   PLT 239.0 06/13/2012   Lab Results  Component Value Date   CREATININE 0.8 06/13/2012   BUN 14 06/13/2012   NA 137 06/13/2012   K 4.3 06/13/2012   CL 100 06/13/2012   CO2 28 06/13/2012   Lab Results  Component Value Date   ALT 18 06/13/2012   AST 22 06/13/2012   ALKPHOS 66 06/13/2012   BILITOT 0.6 06/13/2012   Lab Results  Component Value Date   CHOL 179 06/13/2012   Lab Results  Component Value Date   HDL 67.80 06/13/2012   Lab Results  Component Value Date   LDLCALC 98 06/13/2012   Lab Results  Component Value Date   TRIG  68.0 06/13/2012   Lab Results  Component Value Date   CHOLHDL 3 06/13/2012     Assessment & Plan  HTN (hypertension) Has been under a great deal of stress the past couple of days.  Hyperlipidemia Great improvement with dietary changes, she is feeling better  Diabetes mellitus Improved hgba1c, she has cut down on carbs and fatty foods.

## 2012-06-20 NOTE — Assessment & Plan Note (Signed)
Improved hgba1c, she has cut down on carbs and fatty foods.

## 2012-06-20 NOTE — Assessment & Plan Note (Signed)
Has been under a great deal of stress the past couple of days.

## 2012-06-20 NOTE — Assessment & Plan Note (Signed)
Great improvement with dietary changes, she is feeling better

## 2012-10-20 ENCOUNTER — Encounter: Payer: Self-pay | Admitting: Family Medicine

## 2012-10-20 ENCOUNTER — Ambulatory Visit (INDEPENDENT_AMBULATORY_CARE_PROVIDER_SITE_OTHER): Payer: Medicare Other | Admitting: Family Medicine

## 2012-10-20 VITALS — BP 157/74 | HR 65 | Temp 98.4°F | Ht 58.5 in | Wt 136.4 lb

## 2012-10-20 DIAGNOSIS — E119 Type 2 diabetes mellitus without complications: Secondary | ICD-10-CM

## 2012-10-20 DIAGNOSIS — E785 Hyperlipidemia, unspecified: Secondary | ICD-10-CM

## 2012-10-20 DIAGNOSIS — I10 Essential (primary) hypertension: Secondary | ICD-10-CM

## 2012-10-20 NOTE — Progress Notes (Signed)
Patient ID: Kathryn Collins, female   DOB: March 02, 1927, 78 y.o.   MRN: [id number] TYFFANY WALDROP [id number] 10-Aug-1927 10/20/2012      Progress Note-Follow Up  Subjective  Chief Complaint  Chief Complaint  Patient presents with  . Follow-up    4 month    HPI  Patient is an 78 year old Caucasian female in today for follow up. Unfortunately she ran out of Atenolol 100 mg a couple of days ago. She has a refill waiting for her at her pharmacy but has not picked it up yet. No acute c/o. No CP/palp/SOB/GI or GU c/o. The patient doesn't chief and agree to stress recently with a lot of black mold in work being done at her house. She just got to the month she was widowed 2 years ago also. She says overall she's doing well but it to being anxious and still having trouble figuring out how to pay the bills and doing basic things at home. She denies headaches. She does note mild nasal congestion but no fevers, chills or rhinorrhea. Blood sugars have been well-controlled. Her fasting sugar this morning was well treated she never sees numbers above 150  Past Medical History  Diagnosis Date  . HTN (hypertension) 05/03/2011  . Diabetes mellitus 05/03/2011  . Overweight 05/03/2011  . Hearing loss of aging 05/03/2011  . Renal mass, left 05/03/2011  . Arthritis 05/03/2011  . Memory loss of unknown cause 05/05/2011  . Hyperlipidemia 07/20/2011  . SOM (serous otitis media) 02/16/2012  . Diabetes mellitus 06/20/2012    Past Surgical History  Procedure Date  . Total nephrectomy 2000    left  . Carpal tunnel release     right  . Cholecystectomy   . Breast surgery     biopsy, benign    Family History  Problem Relation Age of Onset  . Hypertension Mother   . Diabetes Sister     type 1  . Diabetes Brother   . Hypertension Brother   . Kidney disease Brother   . Diabetes Maternal Grandfather   . Heart disease Paternal Grandfather   . Heart disease Brother   . Stroke Brother     History   Social  History  . Marital Status: Widowed    Spouse Name: N/A    Number of Children: N/A  . Years of Education: N/A   Occupational History  . Not on file.   Social History Main Topics  . Smoking status: Never Smoker   . Smokeless tobacco: Never Used  . Alcohol Use: No  . Drug Use: No  . Sexually Active: Not on file   Other Topics Concern  . Not on file   Social History Narrative  . No narrative on file    Current Outpatient Prescriptions on File Prior to Visit  Medication Sig Dispense Refill  . amLODipine (NORVASC) 5 MG tablet Take 1 tablet (5 mg total) by mouth at bedtime.  90 tablet  3  . atenolol (TENORMIN) 100 MG tablet Take 1 tablet (100 mg total) by mouth every morning.  90 tablet  3  . Bioflavonoid Products (ESTER C PO) Take 250 mg by mouth 2 (two) times daily. During the winter months      . Boswellia Serrata (BOSWELLIA PO) Take 1 tablet by mouth 3 (three) times daily with meals.       Marland Kitchen CALCIUM LACTATE PO Take 3 tablets by mouth at bedtime.        . Cyanocobalamin (VITAMIN  B-12 CR PO) Take 1 tablet by mouth daily.      Marland Kitchen glucose blood (ONE TOUCH ULTRA TEST) test strip Use as instructed up to qid  Diagnosis code 250.00  100 each  1  . lisinopril (PRINIVIL,ZESTRIL) 5 MG tablet Take 1 tablet (5 mg total) by mouth every morning.  90 tablet  3  . LUMIGAN 0.01 % SOLN       . NON FORMULARY daily. Cataplex, Cataplex ACP, Betacol, Cataplex D, Cataplex B, A-F Betacol, Congaplex, Catalyn, A-F Betafood      . Policosanol (LIPEX PO) Take 1 capsule by mouth 2 (two) times daily.        . Red Yeast Rice Extract (RED YEAST RICE PO) Take 1 tablet by mouth 2 (two) times daily.      . Zinc 10 MG LOZG Use as directed in the mouth or throat.        Allergies  Allergen Reactions  . Shrimp (Shellfish Allergy)     Unknown allergic reaction  . Sulfites     Unknown reaction    Review of Systems  Review of Systems  Constitutional: Negative for fever and malaise/fatigue.  HENT: Positive for  congestion.   Eyes: Negative for discharge.  Respiratory: Negative for shortness of breath.   Cardiovascular: Negative for chest pain, palpitations and leg swelling.  Gastrointestinal: Negative for nausea, abdominal pain and diarrhea.  Genitourinary: Negative for dysuria.  Musculoskeletal: Negative for falls.  Skin: Negative for rash.  Neurological: Negative for loss of consciousness and headaches.  Endo/Heme/Allergies: Negative for polydipsia.  Psychiatric/Behavioral: Negative for depression and suicidal ideas. The patient is not nervous/anxious and does not have insomnia.     Objective  BP 157/74  Pulse 65  Temp 98.4 F (36.9 C) (Temporal)  Ht 4' 10.5" (1.486 m)  Wt 136 lb 6.4 oz (61.871 kg)  BMI 28.02 kg/m2  SpO2 96%  Physical Exam  Physical Exam  Constitutional: She is oriented to person, place, and time and well-developed, well-nourished, and in no distress. No distress.  HENT:  Head: Normocephalic and atraumatic.  Eyes: Conjunctivae normal are normal.  Neck: Neck supple. No thyromegaly present.  Cardiovascular: Normal rate, regular rhythm and normal heart sounds.   No murmur heard. Pulmonary/Chest: Effort normal and breath sounds normal. She has no wheezes.  Abdominal: She exhibits no distension and no mass.  Musculoskeletal: She exhibits no edema.  Lymphadenopathy:    She has no cervical adenopathy.  Neurological: She is alert and oriented to person, place, and time.  Skin: Skin is warm and dry. No rash noted. She is not diaphoretic.  Psychiatric: Memory, affect and judgment normal.    Lab Results  Component Value Date   TSH 2.18 06/13/2012   Lab Results  Component Value Date   WBC 10.0 06/13/2012   HGB 14.2 06/13/2012   HCT 42.3 06/13/2012   MCV 91.6 06/13/2012   PLT 239.0 06/13/2012   Lab Results  Component Value Date   CREATININE 0.8 06/13/2012   BUN 14 06/13/2012   NA 137 06/13/2012   K 4.3 06/13/2012   CL 100 06/13/2012   CO2 28 06/13/2012   Lab  Results  Component Value Date   ALT 18 06/13/2012   AST 22 06/13/2012   ALKPHOS 66 06/13/2012   BILITOT 0.6 06/13/2012   Lab Results  Component Value Date   CHOL 179 06/13/2012   Lab Results  Component Value Date   HDL 67.80 06/13/2012   Lab Results  Component  Value Date   LDLCALC 98 06/13/2012   Lab Results  Component Value Date   TRIG 68.0 06/13/2012   Lab Results  Component Value Date   CHOLHDL 3 06/13/2012     Assessment & Plan  HTN (hypertension) Pressure is up but she is out of her Atenolol now. Will continue Lisinopril and Amlodipine at same dose  Diabetes mellitus Well controlled at this time. No changes to meds hgba1c was 6.1.  Hyperlipidemia Avoid trans fats, continue Red Yeast Rice as well

## 2012-10-20 NOTE — Assessment & Plan Note (Signed)
Avoid trans fats, continue Red Yeast Rice as well

## 2012-10-20 NOTE — Assessment & Plan Note (Signed)
Pressure is up but she is out of her Atenolol now. Will continue Lisinopril and Amlodipine at same dose

## 2012-10-20 NOTE — Patient Instructions (Addendum)
Start Claritin daily if congestion continues    Allergies, Generic Allergies may happen from anything your body is sensitive to. This may be food, medicines, pollens, chemicals, and nearly anything around you in everyday life that produces allergens. An allergen is anything that causes an allergy producing substance. Heredity is often a factor in causing these problems. This means you may have some of the same allergies as your parents. Food allergies happen in all age groups. Food allergies are some of the most severe and life threatening. Some common food allergies are cow's milk, seafood, eggs, nuts, wheat, and soybeans. SYMPTOMS   Swelling around the mouth.  An itchy red rash or hives.  Vomiting or diarrhea.  Difficulty breathing. SEVERE ALLERGIC REACTIONS ARE LIFE-THREATENING. This reaction is called anaphylaxis. It can cause the mouth and throat to swell and cause difficulty with breathing and swallowing. In severe reactions only a trace amount of food (for example, peanut oil in a salad) may cause death within seconds. Seasonal allergies occur in all age groups. These are seasonal because they usually occur during the same season every year. They may be a reaction to molds, grass pollens, or tree pollens. Other causes of problems are house dust mite allergens, pet dander, and mold spores. The symptoms often consist of nasal congestion, a runny itchy nose associated with sneezing, and tearing itchy eyes. There is often an associated itching of the mouth and ears. The problems happen when you come in contact with pollens and other allergens. Allergens are the particles in the air that the body reacts to with an allergic reaction. This causes you to release allergic antibodies. Through a chain of events, these eventually cause you to release histamine into the blood stream. Although it is meant to be protective to the body, it is this release that causes your discomfort. This is why you were  given anti-histamines to feel better. If you are unable to pinpoint the offending allergen, it may be determined by skin or blood testing. Allergies cannot be cured but can be controlled with medicine. Hay fever is a collection of all or some of the seasonal allergy problems. It may often be treated with simple over-the-counter medicine such as diphenhydramine. Take medicine as directed. Do not drink alcohol or drive while taking this medicine. Check with your caregiver or package insert for child dosages. If these medicines are not effective, there are many new medicines your caregiver can prescribe. Stronger medicine such as nasal spray, eye drops, and corticosteroids may be used if the first things you try do not work well. Other treatments such as immunotherapy or desensitizing injections can be used if all else fails. Follow up with your caregiver if problems continue. These seasonal allergies are usually not life threatening. They are generally more of a nuisance that can often be handled using medicine. HOME CARE INSTRUCTIONS   If unsure what causes a reaction, keep a diary of foods eaten and symptoms that follow. Avoid foods that cause reactions.  If hives or rash are present:  Take medicine as directed.  You may use an over-the-counter antihistamine (diphenhydramine) for hives and itching as needed.  Apply cold compresses (cloths) to the skin or take baths in cool water. Avoid hot baths or showers. Heat will make a rash and itching worse.  If you are severely allergic:  Following a treatment for a severe reaction, hospitalization is often required for closer follow-up.  Wear a medic-alert bracelet or necklace stating the allergy.  You and  your family must learn how to give adrenaline or use an anaphylaxis kit.  If you have had a severe reaction, always carry your anaphylaxis kit or EpiPen with you. Use this medicine as directed by your caregiver if a severe reaction is occurring.  Failure to do so could have a fatal outcome. SEEK MEDICAL CARE IF:  You suspect a food allergy. Symptoms generally happen within 30 minutes of eating a food.  Your symptoms have not gone away within 2 days or are getting worse.  You develop new symptoms.  You want to retest yourself or your child with a food or drink you think causes an allergic reaction. Never do this if an anaphylactic reaction to that food or drink has happened before. Only do this under the care of a caregiver. SEEK IMMEDIATE MEDICAL CARE IF:   You have difficulty breathing, are wheezing, or have a tight feeling in your chest or throat.  You have a swollen mouth, or you have hives, swelling, or itching all over your body.  You have had a severe reaction that has responded to your anaphylaxis kit or an EpiPen. These reactions may return when the medicine has worn off. These reactions should be considered life threatening. MAKE SURE YOU:   Understand these instructions.  Will watch your condition.  Will get help right away if you are not doing well or get worse. Document Released: 01/25/2003 Document Revised: 01/24/2012 Document Reviewed: 07/01/2008 [facility] Patient Information 2013 [place], [place].

## 2012-10-20 NOTE — Assessment & Plan Note (Signed)
Well controlled at this time. No changes to meds hgba1c was 6.1.

## 2012-12-12 ENCOUNTER — Other Ambulatory Visit (INDEPENDENT_AMBULATORY_CARE_PROVIDER_SITE_OTHER): Payer: Medicare Other

## 2012-12-12 DIAGNOSIS — E785 Hyperlipidemia, unspecified: Secondary | ICD-10-CM

## 2012-12-12 DIAGNOSIS — I10 Essential (primary) hypertension: Secondary | ICD-10-CM

## 2012-12-12 DIAGNOSIS — E119 Type 2 diabetes mellitus without complications: Secondary | ICD-10-CM

## 2012-12-12 LAB — LIPID PANEL
HDL: 64.9 mg/dL (ref >39.00)
LDL Cholesterol: 91 mg/dL (ref 0–99)
Total CHOL/HDL Ratio: 3
VLDL: 18 mg/dL (ref 0.0–40.0)

## 2012-12-12 LAB — CBC
Hemoglobin: 14.6 g/dL (ref 12.0–15.0)
Platelets: 253 10*3/uL (ref 150.0–400.0)
RDW: 14.1 % (ref 11.5–14.6)
WBC: 9.3 10*3/uL (ref 4.5–10.5)

## 2012-12-12 LAB — RENAL FUNCTION PANEL
CO2: 24 mEq/L (ref 19–32)
Calcium: 9.2 mg/dL (ref 8.4–10.5)
Glucose, Bld: 87 mg/dL (ref 70–99)
Potassium: 3.7 mEq/L (ref 3.5–5.1)
Sodium: 134 mEq/L — ABNORMAL LOW (ref 135–145)

## 2012-12-12 LAB — HEPATIC FUNCTION PANEL: Total Bilirubin: 0.6 mg/dL (ref 0.3–1.2)

## 2012-12-12 NOTE — Progress Notes (Signed)
Labs only

## 2012-12-13 LAB — TSH: TSH: 1.1 u[IU]/mL (ref 0.35–5.50)

## 2012-12-20 ENCOUNTER — Ambulatory Visit: Payer: Medicare Other | Admitting: Family Medicine

## 2012-12-21 ENCOUNTER — Ambulatory Visit (INDEPENDENT_AMBULATORY_CARE_PROVIDER_SITE_OTHER): Payer: Medicare Other | Admitting: Family Medicine

## 2012-12-21 ENCOUNTER — Encounter: Payer: Self-pay | Admitting: Family Medicine

## 2012-12-21 VITALS — BP 148/84 | HR 59 | Temp 97.8°F | Ht 58.5 in | Wt 134.1 lb

## 2012-12-21 DIAGNOSIS — I779 Disorder of arteries and arterioles, unspecified: Secondary | ICD-10-CM

## 2012-12-21 DIAGNOSIS — E119 Type 2 diabetes mellitus without complications: Secondary | ICD-10-CM

## 2012-12-21 DIAGNOSIS — I10 Essential (primary) hypertension: Secondary | ICD-10-CM

## 2012-12-21 DIAGNOSIS — H409 Unspecified glaucoma: Secondary | ICD-10-CM

## 2012-12-21 MED ORDER — LISINOPRIL 10 MG PO TABS: 10.0000 mg | ORAL_TABLET | Freq: Every day | ORAL | Status: DC

## 2012-12-21 MED ORDER — GLUCOSE BLOOD VI STRP: ORAL_STRIP | Status: DC

## 2012-12-21 NOTE — Patient Instructions (Addendum)
Would like to do carotid doppler same day as next appt

## 2012-12-23 ENCOUNTER — Encounter: Payer: Self-pay | Admitting: Family Medicine

## 2012-12-23 DIAGNOSIS — H409 Unspecified glaucoma: Secondary | ICD-10-CM | POA: Insufficient documentation

## 2012-12-23 DIAGNOSIS — I779 Disorder of arteries and arterioles, unspecified: Secondary | ICD-10-CM

## 2012-12-23 HISTORY — DX: Disorder of arteries and arterioles, unspecified: I77.9

## 2012-12-23 HISTORY — DX: Unspecified glaucoma: H40.9

## 2012-12-23 NOTE — Assessment & Plan Note (Signed)
Continue current meds and add Lisinopril 10 mg daily

## 2012-12-23 NOTE — Assessment & Plan Note (Signed)
Patient reports she had a carotid doppler in 2005 which showed low to moderate plaque per patient. Will repeat ultrasound.

## 2012-12-23 NOTE — Assessment & Plan Note (Signed)
hgba1c 6.3 continue current meds and minimize simple carbs.

## 2012-12-23 NOTE — Progress Notes (Signed)
Patient ID: Kathryn Collins, female   DOB: 1927-07-28, 79 y.o.   MRN: [id number] Kathryn Collins [id number] 1927-03-30 12/23/2012      Progress Note-Follow Up  Subjective  Chief Complaint  Chief Complaint  Patient presents with  . Follow-up    6 month    HPI  Patient is a 79 year old Caucasian female who is in today for followup. She is reporting that she had a carotid Doppler done in 2005 which showed some moderate plaques and she is asking if that should be repeated. She denies any headaches or new neurologic complaints. She denies a chest pain, palpitations, shortness of breath, recent illness, GI or GU complaints. She's complaining of some weakness and pain in her right hand which is somewhat new. She reports a distant history of a left arm injury with some persistent left arm numbness as well.  Past Medical History  Diagnosis Date  . HTN (hypertension) 05/03/2011  . Diabetes mellitus 05/03/2011  . Overweight 05/03/2011  . Hearing loss of aging 05/03/2011  . Renal mass, left 05/03/2011  . Arthritis 05/03/2011  . Memory loss of unknown cause 05/05/2011  . Hyperlipidemia 07/20/2011  . SOM (serous otitis media) 02/16/2012  . Diabetes mellitus 06/20/2012  . Carotid artery disease 12/23/2012    Past Surgical History  Procedure Laterality Date  . Total nephrectomy  2000    left  . Carpal tunnel release      right  . Cholecystectomy    . Breast surgery      biopsy, benign    Family History  Problem Relation Age of Onset  . Hypertension Mother   . Diabetes Sister     type 1  . Diabetes Brother   . Hypertension Brother   . Kidney disease Brother   . Diabetes Maternal Grandfather   . Heart disease Paternal Grandfather   . Heart disease Brother   . Stroke Brother     History   Social History  . Marital Status: Widowed    Spouse Name: N/A    Number of Children: N/A  . Years of Education: N/A   Occupational History  . Not on file.   Social History Main Topics  .  Smoking status: Never Smoker   . Smokeless tobacco: Never Used  . Alcohol Use: No  . Drug Use: No  . Sexually Active: Not on file   Other Topics Concern  . Not on file   Social History Narrative  . No narrative on file    Current Outpatient Prescriptions on File Prior to Visit  Medication Sig Dispense Refill  . amLODipine (NORVASC) 5 MG tablet Take 1 tablet (5 mg total) by mouth at bedtime.  90 tablet  3  . atenolol (TENORMIN) 100 MG tablet Take 1 tablet (100 mg total) by mouth every morning.  90 tablet  3  . Bioflavonoid Products (ESTER C PO) Take 250 mg by mouth 2 (two) times daily. During the winter months      . Boswellia Serrata (BOSWELLIA PO) Take 1 tablet by mouth 3 (three) times daily with meals.       Marland Kitchen CALCIUM LACTATE PO Take 3 tablets by mouth at bedtime.        . Cyanocobalamin (VITAMIN B-12 CR PO) Take 1 tablet by mouth daily.      Marland Kitchen latanoprost (XALATAN) 0.005 % ophthalmic solution       . LUMIGAN 0.01 % SOLN       . NON FORMULARY daily. Cataplex,  Cataplex ACP, Betacol, Cataplex D, Cataplex B, A-F Betacol, Congaplex, Catalyn, A-F Betafood      . Policosanol (LIPEX PO) Take 1 capsule by mouth 2 (two) times daily.        . Red Yeast Rice Extract (RED YEAST RICE PO) Take 1 tablet by mouth 2 (two) times daily.      . Zinc 10 MG LOZG Use as directed in the mouth or throat.       No current facility-administered medications on file prior to visit.    Allergies  Allergen Reactions  . Shrimp (Shellfish Allergy)     Unknown allergic reaction  . Sulfites     Unknown reaction    Review of Systems  Review of Systems  Constitutional: Negative for fever and malaise/fatigue.  HENT: Negative for congestion.   Eyes: Negative for discharge.  Respiratory: Negative for shortness of breath.   Cardiovascular: Negative for chest pain, palpitations and leg swelling.  Gastrointestinal: Negative for nausea, abdominal pain and diarrhea.  Genitourinary: Negative for dysuria.   Musculoskeletal: Positive for joint pain. Negative for falls.       B/l hand pain and weakness  Skin: Negative for rash.  Neurological: Negative for loss of consciousness and headaches.  Endo/Heme/Allergies: Negative for polydipsia.  Psychiatric/Behavioral: Negative for depression and suicidal ideas. The patient is not nervous/anxious and does not have insomnia.     Objective  BP 148/84  Pulse 59  Temp(Src) 97.8 F (36.6 C) (Oral)  Ht 4' 10.5" (1.486 m)  Wt 134 lb 1.9 oz (60.836 kg)  BMI 27.55 kg/m2  SpO2 98%  Physical Exam  Physical Exam  Constitutional: She is oriented to person, place, and time and well-developed, well-nourished, and in no distress. No distress.  HENT:  Head: Normocephalic and atraumatic.  Eyes: Conjunctivae are normal.  Neck: Neck supple. No thyromegaly present.  Cardiovascular: Normal rate, regular rhythm and normal heart sounds.   No murmur heard. Pulmonary/Chest: Effort normal and breath sounds normal. She has no wheezes.  Abdominal: She exhibits no distension and no mass.  Musculoskeletal: She exhibits no edema.  Lymphadenopathy:    She has no cervical adenopathy.  Neurological: She is alert and oriented to person, place, and time.  Skin: Skin is warm and dry. No rash noted. She is not diaphoretic.  Psychiatric: Memory, affect and judgment normal.    Lab Results  Component Value Date   TSH 1.10 12/12/2012   Lab Results  Component Value Date   WBC 9.3 12/12/2012   HGB 14.6 12/12/2012   HCT 44.0 12/12/2012   MCV 90.4 12/12/2012   PLT 253.0 12/12/2012   Lab Results  Component Value Date   CREATININE 0.9 12/12/2012   BUN 13 12/12/2012   NA 134* 12/12/2012   K 3.7 12/12/2012   CL 101 12/12/2012   CO2 24 12/12/2012   Lab Results  Component Value Date   ALT 16 12/12/2012   AST 29 12/12/2012   ALKPHOS 63 12/12/2012   BILITOT 0.6 12/12/2012   Lab Results  Component Value Date   CHOL 174 12/12/2012   Lab Results  Component Value Date   HDL  64.90 12/12/2012   Lab Results  Component Value Date   LDLCALC 91 12/12/2012   Lab Results  Component Value Date   TRIG 90.0 12/12/2012   Lab Results  Component Value Date   CHOLHDL 3 12/12/2012     Assessment & Plan  Carotid artery disease Patient reports she had a carotid doppler in  2005 which showed low to moderate plaque per patient. Will repeat ultrasound.  HTN (hypertension) Continue current meds and add Lisinopril 10 mg daily  Diabetes mellitus hgba1c 6.3 continue current meds and minimize simple carbs.

## 2012-12-30 ENCOUNTER — Other Ambulatory Visit: Payer: Self-pay

## 2013-01-16 ENCOUNTER — Other Ambulatory Visit: Payer: Self-pay | Admitting: Family Medicine

## 2013-02-22 ENCOUNTER — Encounter: Payer: Self-pay | Admitting: Family Medicine

## 2013-02-22 ENCOUNTER — Ambulatory Visit (HOSPITAL_BASED_OUTPATIENT_CLINIC_OR_DEPARTMENT_OTHER)
Admission: RE | Admit: 2013-02-22 | Discharge: 2013-02-22 | Disposition: A | Discharge status: Home / Self Care | Payer: Medicare Other | Source: Ambulatory Visit | Attending: Family Medicine | Admitting: Family Medicine

## 2013-02-22 ENCOUNTER — Ambulatory Visit (INDEPENDENT_AMBULATORY_CARE_PROVIDER_SITE_OTHER): Payer: Medicare Other | Admitting: Family Medicine

## 2013-02-22 VITALS — BP 148/68 | HR 63 | Temp 98.3°F | Ht 58.5 in | Wt 131.1 lb

## 2013-02-22 DIAGNOSIS — M129 Arthropathy, unspecified: Secondary | ICD-10-CM

## 2013-02-22 DIAGNOSIS — I10 Essential (primary) hypertension: Secondary | ICD-10-CM

## 2013-02-22 DIAGNOSIS — E119 Type 2 diabetes mellitus without complications: Secondary | ICD-10-CM

## 2013-02-22 DIAGNOSIS — I779 Disorder of arteries and arterioles, unspecified: Secondary | ICD-10-CM

## 2013-02-22 DIAGNOSIS — M1990 Unspecified osteoarthritis, unspecified site: Secondary | ICD-10-CM

## 2013-02-22 NOTE — Assessment & Plan Note (Addendum)
C/o very stiff, painful even red fingers recently, encouraged Krill oil cap, Aspercreme prn

## 2013-02-22 NOTE — Assessment & Plan Note (Signed)
Only taking the Lisinopril at 5 mg daily, may continue to take just this small dose, check a renal panel today

## 2013-02-22 NOTE — Assessment & Plan Note (Signed)
Last hgba1c is 6.3, reports fasting sugars around 100, no changes

## 2013-02-22 NOTE — Patient Instructions (Addendum)
Wants appt in [place] in 4 months.  Try Aspercreme for the hands  Make sure you eat some protein such as nuts with the Dates  Hypertension As your heart beats, it forces blood through your arteries. This force is your blood pressure. If the pressure is too high, it is called hypertension (HTN) or high blood pressure. HTN is dangerous because you may have it and not know it. High blood pressure may mean that your heart has to work harder to pump blood. Your arteries may be narrow or stiff. The extra work puts you at risk for heart disease, stroke, and other problems.  Blood pressure consists of two numbers, a higher number over a lower, 110/72, for example. It is stated as "110 over 72." The ideal is below 120 for the top number (systolic) and under 80 for the bottom (diastolic). Write down your blood pressure today. You should pay close attention to your blood pressure if you have certain conditions such as:  Heart failure.  Prior heart attack.  Diabetes  Chronic kidney disease.  Prior stroke.  Multiple risk factors for heart disease. To see if you have HTN, your blood pressure should be measured while you are seated with your arm held at the level of the heart. It should be measured at least twice. A one-time elevated blood pressure reading (especially in the Emergency Department) does not mean that you need treatment. There may be conditions in which the blood pressure is different between your right and left arms. It is important to see your caregiver soon for a recheck. Most people have essential hypertension which means that there is not a specific cause. This type of high blood pressure may be lowered by changing lifestyle factors such as:  Stress.  Smoking.  Lack of exercise.  Excessive weight.  Drug/tobacco/alcohol use.  Eating less salt. Most people do not have symptoms from high blood pressure until it has caused damage to the body. Effective treatment can often  prevent, delay or reduce that damage. TREATMENT  When a cause has been identified, treatment for high blood pressure is directed at the cause. There are a large number of medications to treat HTN. These fall into several categories, and your caregiver will help you select the medicines that are best for you. Medications may have side effects. You should review side effects with your caregiver. If your blood pressure stays high after you have made lifestyle changes or started on medicines,   Your medication(s) may need to be changed.  Other problems may need to be addressed.  Be certain you understand your prescriptions, and know how and when to take your medicine.  Be sure to follow up with your caregiver within the time frame advised (usually within two weeks) to have your blood pressure rechecked and to review your medications.  If you are taking more than one medicine to lower your blood pressure, make sure you know how and at what times they should be taken. Taking two medicines at the same time can result in blood pressure that is too low. SEEK IMMEDIATE MEDICAL CARE IF:  You develop a severe headache, blurred or changing vision, or confusion.  You have unusual weakness or numbness, or a faint feeling.  You have severe chest or abdominal pain, vomiting, or breathing problems. MAKE SURE YOU:   Understand these instructions.  Will watch your condition.  Will get help right away if you are not doing well or get worse. Document Released: 11/01/2005  Document Revised: 01/24/2012 Document Reviewed: 06/21/2008 [facility] Patient Information 2013 [place], [place].

## 2013-02-23 LAB — RENAL FUNCTION PANEL
Albumin: 4.3 g/dL (ref 3.5–5.2)
CO2: 29 mEq/L (ref 19–32)
Calcium: 10 mg/dL (ref 8.4–10.5)
Creat: 0.77 mg/dL (ref 0.50–1.10)
Glucose, Bld: 99 mg/dL (ref 70–99)

## 2013-02-23 NOTE — Progress Notes (Signed)
Quick Note:  Patient Informed and voiced understanding ______ 

## 2013-02-25 NOTE — Assessment & Plan Note (Signed)
Repeat ultrasound today shows no significant disease. Continue to control risk factors.

## 2013-02-25 NOTE — Progress Notes (Signed)
Patient ID: Kathryn Collins, female   DOB: [birth year], 79 y.o.   MRN: [id number] GWENDA HEINER [id number] 02-06-1927 02/25/2013      Progress Note-Follow Up  Subjective  Chief Complaint  Chief Complaint  Patient presents with  . Follow-up    2 month    HPI  Patient is an 79 year old Caucasian female in today for followup overall she's doing relatively well although she is complaining of some increased pain and stiffness in her hands. She notes some intermittent redness in her joints and mild swelling comes and goes. Has some intermittent back pain as well but falls with a chiropractor with good results. No other new or recent concerns. No fevers or headache. No chest pain or palpitations. No shortness of breath GI or GU concerns noted.  Past Medical History  Diagnosis Date  . HTN (hypertension) 05/03/2011  . Diabetes mellitus 05/03/2011  . Overweight 05/03/2011  . Hearing loss of aging 05/03/2011  . Renal mass, left 05/03/2011  . Arthritis 05/03/2011  . Memory loss of unknown cause 05/05/2011  . Hyperlipidemia 07/20/2011  . SOM (serous otitis media) 02/16/2012  . Diabetes mellitus 06/20/2012  . Carotid artery disease 12/23/2012  . Glaucoma 12/23/2012    Past Surgical History  Procedure Laterality Date  . Total nephrectomy  2000    left  . Carpal tunnel release      right  . Cholecystectomy    . Breast surgery      biopsy, benign    Family History  Problem Relation Age of Onset  . Hypertension Mother   . Diabetes Sister     type 1  . Diabetes Brother   . Hypertension Brother   . Kidney disease Brother   . Diabetes Maternal Grandfather   . Heart disease Paternal Grandfather   . Heart disease Brother   . Stroke Brother     History   Social History  . Marital Status: Widowed    Spouse Name: N/A    Number of Children: N/A  . Years of Education: N/A   Occupational History  . Not on file.   Social History Main Topics  . Smoking status: Never Smoker   . Smokeless  tobacco: Never Used  . Alcohol Use: No  . Drug Use: No  . Sexually Active: Not on file   Other Topics Concern  . Not on file   Social History Narrative  . No narrative on file    Current Outpatient Prescriptions on File Prior to Visit  Medication Sig Dispense Refill  . amLODipine (NORVASC) 5 MG tablet Take 1 tablet (5 mg total) by mouth at bedtime.  90 tablet  3  . atenolol (TENORMIN) 100 MG tablet TAKE 1 TABLET (100 MG TOTAL) BY MOUTH DAILY.  90 tablet  3  . Bioflavonoid Products (ESTER C PO) Take 250 mg by mouth 2 (two) times daily. During the winter months      . Boswellia Serrata (BOSWELLIA PO) Take 1 tablet by mouth 3 (three) times daily with meals.       Marland Kitchen CALCIUM LACTATE PO Take 3 tablets by mouth at bedtime.        . Cyanocobalamin (VITAMIN B-12 CR PO) Take 1 tablet by mouth daily.      Marland Kitchen glucose blood (ONE TOUCH ULTRA TEST) test strip Use as instructed up to qid  Diagnosis code 250.00  100 each  1  . latanoprost (XALATAN) 0.005 % ophthalmic solution       .  lisinopril (PRINIVIL,ZESTRIL) 10 MG tablet Take 1 tablet (10 mg total) by mouth daily.  90 tablet  3  . LUMIGAN 0.01 % SOLN       . NON FORMULARY daily. Cataplex, Cataplex ACP, Betacol, Cataplex D, Cataplex B, A-F Betacol, Congaplex, Catalyn, A-F Betafood      . Policosanol (LIPEX PO) Take 1 capsule by mouth 2 (two) times daily.        . Red Yeast Rice Extract (RED YEAST RICE PO) Take 1 tablet by mouth 2 (two) times daily.      . Zinc 10 MG LOZG Use as directed in the mouth or throat.       No current facility-administered medications on file prior to visit.    Allergies  Allergen Reactions  . Shrimp (Shellfish Allergy)     Unknown allergic reaction  . Sulfites     Unknown reaction    Review of Systems  Review of Systems  Constitutional: Negative for fever and malaise/fatigue.  HENT: Negative for congestion.   Eyes: Negative for discharge.  Respiratory: Negative for shortness of breath.   Cardiovascular:  Negative for chest pain, palpitations and leg swelling.  Gastrointestinal: Negative for nausea, abdominal pain and diarrhea.  Genitourinary: Negative for dysuria.  Musculoskeletal: Positive for joint pain. Negative for falls.  Skin: Negative for rash.  Neurological: Negative for loss of consciousness and headaches.  Endo/Heme/Allergies: Negative for polydipsia.  Psychiatric/Behavioral: Negative for depression and suicidal ideas. The patient is not nervous/anxious and does not have insomnia.     Objective  BP 148/68  Pulse 63  Temp(Src) 98.3 F (36.8 C) (Oral)  Ht 4' 10.5" (1.486 m)  Wt 131 lb 1.9 oz (59.476 kg)  BMI 26.93 kg/m2  SpO2 96%  Physical Exam  Physical Exam  Constitutional: She is oriented to person, place, and time and well-developed, well-nourished, and in no distress. No distress.  HENT:  Head: Normocephalic and atraumatic.  Eyes: Conjunctivae are normal.  Neck: Neck supple. No thyromegaly present.  Cardiovascular: Normal rate, regular rhythm and normal heart sounds.   Pulmonary/Chest: Effort normal and breath sounds normal. She has no wheezes.  Abdominal: She exhibits no distension and no mass.  Musculoskeletal: She exhibits no edema.  Lymphadenopathy:    She has no cervical adenopathy.  Neurological: She is alert and oriented to person, place, and time.  Skin: Skin is warm and dry. No rash noted. She is not diaphoretic.  Psychiatric: Memory, affect and judgment normal.    Lab Results  Component Value Date   TSH 1.10 12/12/2012   Lab Results  Component Value Date   WBC 9.3 12/12/2012   HGB 14.6 12/12/2012   HCT 44.0 12/12/2012   MCV 90.4 12/12/2012   PLT 253.0 12/12/2012   Lab Results  Component Value Date   CREATININE 0.77 02/22/2013   BUN 17 02/22/2013   NA 137 02/22/2013   K 4.3 02/22/2013   CL 101 02/22/2013   CO2 29 02/22/2013   Lab Results  Component Value Date   ALT 16 12/12/2012   AST 29 12/12/2012   ALKPHOS 63 12/12/2012   BILITOT 0.6  12/12/2012   Lab Results  Component Value Date   CHOL 174 12/12/2012   Lab Results  Component Value Date   HDL 64.90 12/12/2012   Lab Results  Component Value Date   LDLCALC 91 12/12/2012   Lab Results  Component Value Date   TRIG 90.0 12/12/2012   Lab Results  Component Value Date  CHOLHDL 3 12/12/2012     Assessment & Plan  HTN (hypertension) Only taking the Lisinopril at 5 mg daily, may continue to take just this small dose, check a renal panel today  Diabetes mellitus Last hgba1c is 6.3, reports fasting sugars around 100, no changes  Arthritis C/o very stiff, painful even red fingers recently, encouraged Krill oil cap, Aspercreme prn  Carotid artery disease Repeat ultrasound today shows no significant disease. Continue to control risk factors.

## 2013-04-22 ENCOUNTER — Other Ambulatory Visit: Payer: Self-pay | Admitting: Family Medicine

## 2013-05-21 ENCOUNTER — Telehealth: Payer: Self-pay | Admitting: Family Medicine

## 2013-05-21 DIAGNOSIS — I10 Essential (primary) hypertension: Secondary | ICD-10-CM

## 2013-05-21 MED ORDER — AMLODIPINE BESYLATE 5 MG PO TABS: 5.0000 mg | ORAL_TABLET | Freq: Every day | ORAL | Status: DC

## 2013-05-21 NOTE — Telephone Encounter (Signed)
Refill-amlodipine besylate 5mg  tab. Take one tablet by mouth daily. Qty 90 last fill 2.13.14

## 2013-05-21 NOTE — Telephone Encounter (Signed)
Rx request to pharmacy/SLS  

## 2013-05-23 ENCOUNTER — Other Ambulatory Visit: Payer: Self-pay

## 2013-05-23 DIAGNOSIS — I10 Essential (primary) hypertension: Secondary | ICD-10-CM

## 2013-05-23 MED ORDER — AMLODIPINE BESYLATE 5 MG PO TABS: 5.0000 mg | ORAL_TABLET | Freq: Every day | ORAL | Status: DC

## 2013-05-24 ENCOUNTER — Telehealth: Payer: Self-pay | Admitting: *Deleted

## 2013-05-24 DIAGNOSIS — I10 Essential (primary) hypertension: Secondary | ICD-10-CM

## 2013-05-24 MED ORDER — AMLODIPINE BESYLATE 5 MG PO TABS: 5.0000 mg | ORAL_TABLET | Freq: Every day | ORAL | Status: DC

## 2013-05-24 NOTE — Telephone Encounter (Signed)
Pharmacy requesting refill on Amlodipine requested prior; Rx was entered as "No Print" status, sent to pharmacy/SLS

## 2013-06-20 ENCOUNTER — Other Ambulatory Visit: Payer: Self-pay

## 2013-06-21 ENCOUNTER — Ambulatory Visit (INDEPENDENT_AMBULATORY_CARE_PROVIDER_SITE_OTHER): Payer: Medicare Other | Admitting: Family Medicine

## 2013-06-21 ENCOUNTER — Encounter: Payer: Self-pay | Admitting: Family Medicine

## 2013-06-21 VITALS — BP 140/78 | HR 92 | Temp 97.9°F | Ht 58.5 in | Wt 129.0 lb

## 2013-06-21 DIAGNOSIS — I10 Essential (primary) hypertension: Secondary | ICD-10-CM

## 2013-06-21 DIAGNOSIS — R634 Abnormal weight loss: Secondary | ICD-10-CM

## 2013-06-21 DIAGNOSIS — E785 Hyperlipidemia, unspecified: Secondary | ICD-10-CM

## 2013-06-21 DIAGNOSIS — E119 Type 2 diabetes mellitus without complications: Secondary | ICD-10-CM

## 2013-06-21 LAB — HEPATIC FUNCTION PANEL
AST: 33 U/L (ref 0–37)
Albumin: 4.1 g/dL (ref 3.5–5.2)
Alkaline Phosphatase: 85 U/L (ref 39–117)
Total Protein: 7.6 g/dL (ref 6.0–8.3)

## 2013-06-21 LAB — CBC
HCT: 42.1 % (ref 36.0–46.0)
Hemoglobin: 14.4 g/dL (ref 12.0–15.0)
MCV: 87.7 fL (ref 78.0–100.0)
RBC: 4.8 MIL/uL (ref 3.87–5.11)
WBC: 10.4 10*3/uL (ref 4.0–10.5)

## 2013-06-21 LAB — HEMOGLOBIN A1C: Hgb A1c MFr Bld: 6.1 % — ABNORMAL HIGH (ref <5.7)

## 2013-06-21 LAB — RENAL FUNCTION PANEL
BUN: 13 mg/dL (ref 6–23)
CO2: 27 mEq/L (ref 19–32)
Chloride: 101 mEq/L (ref 96–112)
Creat: 0.91 mg/dL (ref 0.50–1.10)

## 2013-06-21 MED ORDER — COD LIVER OIL 5000-500 UNIT/5ML PO OIL: TOPICAL_OIL | ORAL | Status: AC

## 2013-06-21 MED ORDER — MAGNESIUM LACTATE 84 MG (7MEQ) PO TBCR: 84.0000 mg | EXTENDED_RELEASE_TABLET | Freq: Every day | ORAL | Status: AC

## 2013-06-21 MED ORDER — LISINOPRIL 5 MG PO TABS: 5.0000 mg | ORAL_TABLET | Freq: Every day | ORAL | Status: DC

## 2013-06-21 NOTE — Assessment & Plan Note (Signed)
Improved some, will stay off Atenolol for now. Hold Amlodipine for now but if bp trends up will restart at 2.5 mg daily.

## 2013-06-21 NOTE — Patient Instructions (Addendum)
Pulse should be between 60-90 if begins to run high restart Amlodipine at 2.5 mg daily (1/2 of a 5 mg tab)  Blood pressure should be roughly 100 to 145 on top and 60 to 90 on bottom while resting  Try Aspercreme or Salon Pas cream as needed for arthritis  Start an 81 mg enteric aspirin daily when your stomach feels better  Consider a probiotic cap daily, see if Dr Georgeanne Nim recommends  Consider [employer] with some extra protein powder twice a day   Hypertension As your heart beats, it forces blood through your arteries. This force is your blood pressure. If the pressure is too high, it is called hypertension (HTN) or high blood pressure. HTN is dangerous because you may have it and not know it. High blood pressure may mean that your heart has to work harder to pump blood. Your arteries may be narrow or stiff. The extra work puts you at risk for heart disease, stroke, and other problems.  Blood pressure consists of two numbers, a higher number over a lower, 110/72, for example. It is stated as "110 over 72." The ideal is below 120 for the top number (systolic) and under 80 for the bottom (diastolic). Write down your blood pressure today. You should pay close attention to your blood pressure if you have certain conditions such as:  Heart failure.  Prior heart attack.  Diabetes  Chronic kidney disease.  Prior stroke.  Multiple risk factors for heart disease. To see if you have HTN, your blood pressure should be measured while you are seated with your arm held at the level of the heart. It should be measured at least twice. A one-time elevated blood pressure reading (especially in the Emergency Department) does not mean that you need treatment. There may be conditions in which the blood pressure is different between your right and left arms. It is important to see your caregiver soon for a recheck. Most people have essential hypertension which means that there is not a  specific cause. This type of high blood pressure may be lowered by changing lifestyle factors such as:  Stress.  Smoking.  Lack of exercise.  Excessive weight.  Drug/tobacco/alcohol use.  Eating less salt. Most people do not have symptoms from high blood pressure until it has caused damage to the body. Effective treatment can often prevent, delay or reduce that damage. TREATMENT  When a cause has been identified, treatment for high blood pressure is directed at the cause. There are a large number of medications to treat HTN. These fall into several categories, and your caregiver will help you select the medicines that are best for you. Medications may have side effects. You should review side effects with your caregiver. If your blood pressure stays high after you have made lifestyle changes or started on medicines,   Your medication(s) may need to be changed.  Other problems may need to be addressed.  Be certain you understand your prescriptions, and know how and when to take your medicine.  Be sure to follow up with your caregiver within the time frame advised (usually within two weeks) to have your blood pressure rechecked and to review your medications.  If you are taking more than one medicine to lower your blood pressure, make sure you know how and at what times they should be taken. Taking two medicines at the same time can result in blood pressure that is too low. SEEK IMMEDIATE MEDICAL CARE IF:  You develop  a severe headache, blurred or changing vision, or confusion.  You have unusual weakness or numbness, or a faint feeling.  You have severe chest or abdominal pain, vomiting, or breathing problems. MAKE SURE YOU:   Understand these instructions.  Will watch your condition.  Will get help right away if you are not doing well or get worse. Document Released: 11/01/2005 Document Revised: 01/24/2012 Document Reviewed: 06/21/2008 [facility] Patient Information 2014  [place], [place].

## 2013-06-24 ENCOUNTER — Encounter: Payer: Self-pay | Admitting: Family Medicine

## 2013-06-24 NOTE — Assessment & Plan Note (Signed)
hgba1c 6.1, no need for further meds.

## 2013-06-24 NOTE — Assessment & Plan Note (Addendum)
Avoid trans fats, continue red yeast rice

## 2013-06-24 NOTE — Assessment & Plan Note (Signed)
Encouraged to add a protein powder tid

## 2013-06-24 NOTE — Progress Notes (Signed)
Patient ID: Kathryn Collins, female   DOB: 1927/10/14, 79 y.o.   MRN: [id number] Kathryn Collins [id number] Dec 18, 1926 06/24/2013      Progress Note-Follow Up  Subjective  Chief Complaint  Chief Complaint  Patient presents with  . Follow-up    4 month    HPI  Patient is a 79 year old Caucasian female who is in today for followup. She has stopped her atenolol and she was feeling weak and she feels better. She has no other acute complaints. She's had some trouble for leg cramps but after adding magnesium was improved. She has been eating well but acknowledges a limited appetite. Continues to seek regular chiropractic care and her back pain is well-controlled. No recent fevers, chest pain, palpitations, shortness of breath, GI or GU concerns.  Past Medical History  Diagnosis Date  . HTN (hypertension) 05/03/2011  . Diabetes mellitus 05/03/2011  . Overweight(278.02) 05/03/2011  . Hearing loss of aging 05/03/2011  . Renal mass, left 05/03/2011  . Arthritis 05/03/2011  . Memory loss of unknown cause 05/05/2011  . Hyperlipidemia 07/20/2011  . SOM (serous otitis media) 02/16/2012  . Diabetes mellitus 06/20/2012  . Carotid artery disease 12/23/2012  . Glaucoma 12/23/2012  . Loss of weight 05/03/2011    Past Surgical History  Procedure Laterality Date  . Total nephrectomy  2000    left  . Carpal tunnel release      right  . Cholecystectomy    . Breast surgery      biopsy, benign    Family History  Problem Relation Age of Onset  . Hypertension Mother   . Diabetes Sister     type 1  . Diabetes Brother   . Hypertension Brother   . Kidney disease Brother   . Diabetes Maternal Grandfather   . Heart disease Paternal Grandfather   . Heart disease Brother   . Stroke Brother     History   Social History  . Marital Status: Widowed    Spouse Name: N/A    Number of Children: N/A  . Years of Education: N/A   Occupational History  . Not on file.   Social History Main Topics  .  Smoking status: Never Smoker   . Smokeless tobacco: Never Used  . Alcohol Use: No  . Drug Use: No  . Sexually Active: Not on file   Other Topics Concern  . Not on file   Social History Narrative  . No narrative on file    Current Outpatient Prescriptions on File Prior to Visit  Medication Sig Dispense Refill  . Bioflavonoid Products (ESTER C PO) Take 250 mg by mouth 2 (two) times daily. During the winter months      . Boswellia Serrata (BOSWELLIA PO) Take 1 tablet by mouth 3 (three) times daily with meals.       Marland Kitchen CALCIUM LACTATE PO Take 3 tablets by mouth at bedtime.        . Cyanocobalamin (VITAMIN B-12 CR PO) Take 1 tablet by mouth daily.      Marland Kitchen glucose blood (ONE TOUCH ULTRA TEST) test strip Use as instructed up to qid  Diagnosis code 250.00  100 each  1  . latanoprost (XALATAN) 0.005 % ophthalmic solution       . LUMIGAN 0.01 % SOLN       . NON FORMULARY daily. Cataplex, Cataplex ACP, Betacol, Cataplex D, Cataplex B, A-F Betacol, Congaplex, Catalyn, A-F Betafood      . Policosanol (LIPEX PO) Take  1 capsule by mouth 2 (two) times daily.        . Red Yeast Rice Extract (RED YEAST RICE PO) Take 1 tablet by mouth 2 (two) times daily.      . Zinc 10 MG LOZG Use as directed in the mouth or throat.       No current facility-administered medications on file prior to visit.    Allergies  Allergen Reactions  . Shrimp (Shellfish Allergy)     Unknown allergic reaction  . Sulfites     Unknown reaction    Review of Systems  Review of Systems  Constitutional: Negative for fever and malaise/fatigue.  HENT: Negative for congestion.   Eyes: Negative for pain and discharge.  Respiratory: Negative for shortness of breath.   Cardiovascular: Negative for chest pain, palpitations and leg swelling.  Gastrointestinal: Negative for nausea, abdominal pain and diarrhea.  Genitourinary: Negative for dysuria.  Musculoskeletal: Negative for falls.  Skin: Negative for rash.  Neurological:  Negative for loss of consciousness and headaches.  Endo/Heme/Allergies: Negative for polydipsia.  Psychiatric/Behavioral: Negative for depression and suicidal ideas. The patient is not nervous/anxious and does not have insomnia.     Objective  BP 140/78  Pulse 92  Temp(Src) 97.9 F (36.6 C) (Oral)  Ht 4' 10.5" (1.486 m)  Wt 129 lb (58.514 kg)  BMI 26.5 kg/m2  SpO2 96%  Physical Exam  Physical Exam  Constitutional: She is oriented to person, place, and time and well-developed, well-nourished, and in no distress. No distress.  HENT:  Head: Normocephalic and atraumatic.  Eyes: Conjunctivae are normal.  Neck: Neck supple. No thyromegaly present.  Cardiovascular: Normal rate, regular rhythm and normal heart sounds.   No murmur heard. Pulmonary/Chest: Effort normal and breath sounds normal. She has no wheezes.  Abdominal: She exhibits no distension and no mass.  Musculoskeletal: She exhibits no edema.  Lymphadenopathy:    She has no cervical adenopathy.  Neurological: She is alert and oriented to person, place, and time.  Skin: Skin is warm and dry. No rash noted. She is not diaphoretic.  Psychiatric: Memory, affect and judgment normal.    Lab Results  Component Value Date   TSH 1.269 06/21/2013   Lab Results  Component Value Date   WBC 10.4 06/21/2013   HGB 14.4 06/21/2013   HCT 42.1 06/21/2013   MCV 87.7 06/21/2013   PLT 281 06/21/2013   Lab Results  Component Value Date   CREATININE 0.91 06/21/2013   BUN 13 06/21/2013   NA 137 06/21/2013   K 4.3 06/21/2013   CL 101 06/21/2013   CO2 27 06/21/2013   Lab Results  Component Value Date   ALT 16 06/21/2013   AST 33 06/21/2013   ALKPHOS 85 06/21/2013   BILITOT 0.4 06/21/2013   Lab Results  Component Value Date   CHOL 184 06/21/2013   Lab Results  Component Value Date   HDL 78 06/21/2013   Lab Results  Component Value Date   LDLCALC 76 06/21/2013   Lab Results  Component Value Date   TRIG 151* 06/21/2013   Lab Results  Component Value  Date   CHOLHDL 2.4 06/21/2013     Assessment & Plan  HTN (hypertension) Improved some, will stay off Atenolol for now. Hold Amlodipine for now but if bp trends up will restart at 2.5 mg daily.   Loss of weight Encouraged to add a protein powder tid  Hyperlipidemia Avoid trans fats, continue red yeast rice  Diabetes mellitus  hgba1c 6.1, no need for further meds.

## 2013-06-25 ENCOUNTER — Other Ambulatory Visit: Payer: Self-pay | Admitting: Family Medicine

## 2013-09-20 ENCOUNTER — Other Ambulatory Visit: Payer: Self-pay

## 2013-10-18 ENCOUNTER — Ambulatory Visit (INDEPENDENT_AMBULATORY_CARE_PROVIDER_SITE_OTHER): Payer: Medicare Other | Admitting: Family Medicine

## 2013-10-18 ENCOUNTER — Other Ambulatory Visit: Payer: Self-pay | Admitting: Family Medicine

## 2013-10-18 ENCOUNTER — Encounter: Payer: Self-pay | Admitting: Family Medicine

## 2013-10-18 VITALS — BP 146/82 | HR 79 | Temp 98.2°F | Ht 58.5 in | Wt 122.1 lb

## 2013-10-18 DIAGNOSIS — R1013 Epigastric pain: Secondary | ICD-10-CM

## 2013-10-18 DIAGNOSIS — E119 Type 2 diabetes mellitus without complications: Secondary | ICD-10-CM

## 2013-10-18 DIAGNOSIS — I10 Essential (primary) hypertension: Secondary | ICD-10-CM

## 2013-10-18 DIAGNOSIS — H9110 Presbycusis, unspecified ear: Secondary | ICD-10-CM

## 2013-10-18 DIAGNOSIS — R634 Abnormal weight loss: Secondary | ICD-10-CM

## 2013-10-18 DIAGNOSIS — H9111 Presbycusis, right ear: Secondary | ICD-10-CM

## 2013-10-18 DIAGNOSIS — E785 Hyperlipidemia, unspecified: Secondary | ICD-10-CM

## 2013-10-18 LAB — LIPID PANEL
HDL: 75 mg/dL (ref >39)
LDL Cholesterol: 71 mg/dL (ref 0–99)
Total CHOL/HDL Ratio: 2.4 Ratio
Triglycerides: 169 mg/dL — ABNORMAL HIGH (ref <150)
VLDL: 34 mg/dL (ref 0–40)

## 2013-10-18 LAB — RENAL FUNCTION PANEL
CO2: 28 mEq/L (ref 19–32)
Chloride: 98 mEq/L (ref 96–112)
Creat: 0.7 mg/dL (ref 0.50–1.10)
Glucose, Bld: 98 mg/dL (ref 70–99)
Phosphorus: 3.6 mg/dL (ref 2.3–4.6)

## 2013-10-18 LAB — CBC
HCT: 42.5 % (ref 36.0–46.0)
Hemoglobin: 14.5 g/dL (ref 12.0–15.0)
MCV: 88.7 fL (ref 78.0–100.0)
RDW: 13.9 % (ref 11.5–15.5)
WBC: 11.5 10*3/uL — ABNORMAL HIGH (ref 4.0–10.5)

## 2013-10-18 LAB — HEPATIC FUNCTION PANEL
ALT: 17 U/L (ref 0–35)
Indirect Bilirubin: 0.2 mg/dL (ref 0.0–0.9)
Total Protein: 7.1 g/dL (ref 6.0–8.3)

## 2013-10-18 NOTE — Progress Notes (Signed)
Patient ID: Kathryn Collins, female   DOB: Sep 14, 1927, 79 y.o.   MRN: [id number] Kathryn Collins [id number] May 07, 1927 10/18/2013      Progress Note-Follow Up  Subjective  Chief Complaint  Chief Complaint  Patient presents with  . Follow-up    4 month    HPI  Patient is a 79-year-old Caucasian female who is in today for followup. She started calling with high levels of stress and anxiety. She was widowed 3 years ago and is having trouble maintaining her home and her life without her husband. 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 Overall she feels she wants to stay in her home she's not had any falls. She is struggling with some mild constipation improved some with fiber and probiotic. Declines at this time. She is in the process during a workout he keeps having trouble with it once. No chest pain or palpitations no recent illness. No shortness of breath GU complaints. Is following closely with chiropractic and ophthalmology.  Past Medical History  Diagnosis Date  . HTN (hypertension) 05/03/2011  . Diabetes mellitus 05/03/2011  . Overweight(278.02) 05/03/2011  . Hearing loss of aging 05/03/2011  . Renal mass, left 05/03/2011  . Arthritis 05/03/2011  . Memory loss of unknown cause 05/05/2011  . Hyperlipidemia 07/20/2011  . SOM (serous otitis media) 02/16/2012  . Diabetes mellitus 06/20/2012  . Carotid artery disease 12/23/2012  . Glaucoma 12/23/2012  . Loss of weight 05/03/2011    Past Surgical History  Procedure Laterality Date  . Total nephrectomy  2000    left  . Carpal tunnel release      right  . Cholecystectomy    . Breast surgery      biopsy, benign    Family History  Problem Relation Age of Onset  . Hypertension Mother   . Diabetes Sister     type 1  . Diabetes Brother   . Hypertension Brother   . Kidney disease Brother   . Diabetes Maternal Grandfather   . Heart disease Paternal Grandfather   . Heart disease Brother    . Stroke Brother     History   Social History  . Marital Status: Widowed    Spouse Name: N/A    Number of Children: N/A  . Years of Education: N/A   Occupational History  . Not on file.   Social History Main Topics  . Smoking status: Never Smoker   . Smokeless tobacco: Never Used  . Alcohol Use: No  . Drug Use: No  . Sexual Activity: Not on file   Other Topics Concern  . Not on file   Social History Narrative  . No narrative on file    Current Outpatient Prescriptions on File Prior to Visit  Medication Sig Dispense Refill  . Bioflavonoid Products (ESTER C PO) Take 250 mg by mouth 2 (two) times daily. During the winter months      . Boswellia Serrata (BOSWELLIA PO) Take 1 tablet by mouth 3 (three) times daily with meals.       Marland Kitchen CALCIUM LACTATE PO Take 3 tablets by mouth at bedtime.        . Cod Liver Oil 5000-500 UNIT/5ML OIL 2 tsp po daily    0  . Cyanocobalamin (VITAMIN B-12 CR PO) Take 1 tablet by mouth daily.      Marland Kitchen latanoprost (XALATAN) 0.005 % ophthalmic solution       . lisinopril (PRINIVIL,ZESTRIL) 5  MG tablet Take 1 tablet (5 mg total) by mouth daily.  30 tablet  3  . LUMIGAN 0.01 % SOLN       . magnesium (MAGTAB) 84 MG ( ) TBCR SR tablet Take 1 tablet (84 mg total) by mouth daily.  120 tablet    . NON FORMULARY daily. Cataplex, Cataplex ACP, Betacol, Cataplex D, Cataplex B, A-F Betacol, Congaplex, Catalyn, A-F Betafood      . ONE TOUCH ULTRA TEST test strip TEST as directed UPTO four times a day  100 each  1  . Policosanol (LIPEX PO) Take 1 capsule by mouth 2 (two) times daily.        . Red Yeast Rice Extract (RED YEAST RICE PO) Take 1 tablet by mouth 2 (two) times daily.      . Zinc 10 MG LOZG Use as directed in the mouth or throat.       No current facility-administered medications on file prior to visit.    Allergies  Allergen Reactions  . Shrimp [Shellfish Allergy]     Unknown allergic reaction  . Sulfites     Unknown reaction    Review of  Systems  Review of Systems  Constitutional: Negative for fever and malaise/fatigue.  HENT: Positive for hearing loss. Negative for congestion.   Eyes: Negative for discharge.  Respiratory: Negative for shortness of breath.   Cardiovascular: Negative for chest pain, palpitations and leg swelling.  Gastrointestinal: Negative for nausea, abdominal pain and diarrhea.  Genitourinary: Negative for dysuria.  Musculoskeletal: Negative for falls.  Skin: Negative for rash.  Neurological: Negative for loss of consciousness and headaches.  Endo/Heme/Allergies: Negative for polydipsia.  Psychiatric/Behavioral: Negative for depression and suicidal ideas. The patient is not nervous/anxious and does not have insomnia.     Objective  BP 158/84  Pulse 79  Temp(Src) 98.2 F (36.8 C) (Oral)  Ht 4' 10.5" (1.486 m)  Wt 122 lb 1.3 oz (55.375 kg)  BMI 25.08 kg/m2  SpO2 98%  Physical Exam  Physical Exam  Constitutional: She is oriented to person, place, and time and well-developed, well-nourished, and in no distress. No distress.  HENT:  Head: Normocephalic and atraumatic.  Eyes: Conjunctivae are normal.  Neck: Neck supple. No thyromegaly present.  Cardiovascular: Normal rate, regular rhythm and normal heart sounds.   No murmur heard. Pulmonary/Chest: Effort normal and breath sounds normal. She has no wheezes.  Abdominal: She exhibits no distension and no mass.  Musculoskeletal: She exhibits no edema.  Lymphadenopathy:    She has no cervical adenopathy.  Neurological: She is alert and oriented to person, place, and time.  Skin: Skin is warm and dry. No rash noted. She is not diaphoretic.  Psychiatric: Memory, affect and judgment normal.    Lab Results  Component Value Date   TSH 1.269 06/21/2013   Lab Results  Component Value Date   WBC 10.4 06/21/2013   HGB 14.4 06/21/2013   HCT 42.1 06/21/2013   MCV 87.7 06/21/2013   PLT 281 06/21/2013   Lab Results  Component Value Date   CREATININE 0.91  06/21/2013   BUN 13 06/21/2013   NA 137 06/21/2013   K 4.3 06/21/2013   CL 101 06/21/2013   CO2 27 06/21/2013   Lab Results  Component Value Date   ALT 16 06/21/2013   AST 33 06/21/2013   ALKPHOS 85 06/21/2013   BILITOT 0.4 06/21/2013   Lab Results  Component Value Date   CHOL 184 06/21/2013   Lab Results  Component Value Date   HDL 78 06/21/2013   Lab Results  Component Value Date   LDLCALC 76 06/21/2013   Lab Results  Component Value Date   TRIG 151* 06/21/2013   Lab Results  Component Value Date   CHOLHDL 2.4 06/21/2013     Assessment & Plan   HTN (hypertension) Improved on recheck will continue to monitor  Diabetes mellitus Minimize simple carbs and increase exercise  Hyperlipidemia Tolerating red yeast rice caps daily, avoid trans fats  Loss of weight Patient agrees to increase po intake and increase protein. Patient declines further work up at this time and does believe it is partially related to anxiety reassess next visit and consider further work up if weight loss persists.  Hearing loss of aging Is currently trying to find a new hearing aide

## 2013-10-18 NOTE — Progress Notes (Signed)
Pre visit review using our clinic review tool, if applicable. No additional management support is needed unless otherwise documented below in the visit note. 

## 2013-10-18 NOTE — Patient Instructions (Addendum)
Try 2 Tums twice a dayprobiotics Gastroesophageal Reflux Disease, Adult Gastroesophageal reflux disease (GERD) happens when acid from your stomach flows up into the esophagus. When acid comes in contact with the esophagus, the acid causes soreness (inflammation) in the esophagus. Over time, GERD may create small holes (ulcers) in the lining of the esophagus. CAUSES   Increased body weight. This puts pressure on the stomach, making acid rise from the stomach into the esophagus.  Smoking. This increases acid production in the stomach.  Drinking alcohol. This causes decreased pressure in the lower esophageal sphincter (valve or ring of muscle between the esophagus and stomach), allowing acid from the stomach into the esophagus.  Late evening meals and a full stomach. This increases pressure and acid production in the stomach.  A malformed lower esophageal sphincter. Sometimes, no cause is found. SYMPTOMS   Burning pain in the lower part of the mid-chest behind the breastbone and in the mid-stomach area. This may occur twice a week or more often.  Trouble swallowing.  Sore throat.  Dry cough.  Asthma-like symptoms including chest tightness, shortness of breath, or wheezing. DIAGNOSIS  Your caregiver may be able to diagnose GERD based on your symptoms. In some cases, X-rays and other tests may be done to check for complications or to check the condition of your stomach and esophagus. TREATMENT  Your caregiver may recommend over-the-counter or prescription medicines to help decrease acid production. Ask your caregiver before starting or adding any new medicines.  HOME CARE INSTRUCTIONS   Change the factors that you can control. Ask your caregiver for guidance concerning weight loss, quitting smoking, and alcohol consumption.  Avoid foods and drinks that make your symptoms worse, such as:  Caffeine or alcoholic drinks.  Chocolate.  Peppermint or mint flavorings.  Garlic and  onions.  Spicy foods.  Citrus fruits, such as oranges, lemons, or limes.  Tomato-based foods such as sauce, chili, salsa, and pizza.  Fried and fatty foods.  Avoid lying down for the 3 hours prior to your bedtime or prior to taking a nap.  Eat small, frequent meals instead of large meals.  Wear loose-fitting clothing. Do not wear anything tight around your waist that causes pressure on your stomach.  Raise the head of your bed 6 to 8 inches with wood blocks to help you sleep. Extra pillows will not help.  Only take over-the-counter or prescription medicines for pain, discomfort, or fever as directed by your caregiver.  Do not take aspirin, ibuprofen, or other nonsteroidal anti-inflammatory drugs (NSAIDs). SEEK IMMEDIATE MEDICAL CARE IF:   You have pain in your arms, neck, jaw, teeth, or back.  Your pain increases or changes in intensity or duration.  You develop nausea, vomiting, or sweating (diaphoresis).  You develop shortness of breath, or you faint.  Your vomit is green, yellow, black, or looks like coffee grounds or blood.  Your stool is red, bloody, or black. These symptoms could be signs of other problems, such as heart disease, gastric bleeding, or esophageal bleeding. MAKE SURE YOU:   Understand these instructions.  Will watch your condition.  Will get help right away if you are not doing well or get worse. Document Released: 08/11/2005 Document Revised: 01/24/2012 Document Reviewed: 05/21/2011 [facility] Patient Information 2014 [place], [place].

## 2013-10-19 LAB — H. PYLORI ANTIBODY, IGG: H Pylori IgG: 0.4 {ISR}

## 2013-10-20 NOTE — Assessment & Plan Note (Signed)
Is currently trying to find a new hearing aide

## 2013-10-20 NOTE — Assessment & Plan Note (Signed)
Patient agrees to increase po intake and increase protein. Patient declines further work up at this time and does believe it is partially related to anxiety reassess next visit and consider further work up if weight loss persists.

## 2013-10-20 NOTE — Assessment & Plan Note (Signed)
Improved on recheck will continue to monitor 

## 2013-10-20 NOTE — Assessment & Plan Note (Signed)
Tolerating red yeast rice caps daily, avoid trans fats

## 2013-10-20 NOTE — Assessment & Plan Note (Signed)
Minimize simple carbs and increase exercise

## 2013-11-20 ENCOUNTER — Ambulatory Visit: Payer: Medicare Other | Admitting: Family Medicine

## 2013-11-23 ENCOUNTER — Ambulatory Visit: Payer: Medicare Other | Admitting: Family Medicine

## 2013-11-26 ENCOUNTER — Ambulatory Visit: Payer: Medicare Other | Admitting: Family Medicine

## 2014-01-03 ENCOUNTER — Ambulatory Visit: Payer: Medicare Other | Admitting: Family Medicine

## 2014-01-21 HISTORY — PX: EYE SURGERY: SHX253

## 2014-01-25 ENCOUNTER — Ambulatory Visit (INDEPENDENT_AMBULATORY_CARE_PROVIDER_SITE_OTHER): Payer: Medicare HMO | Admitting: Family Medicine

## 2014-01-25 ENCOUNTER — Encounter: Payer: Self-pay | Admitting: Family Medicine

## 2014-01-25 VITALS — BP 162/84 | HR 85 | Temp 97.5°F | Ht 58.5 in | Wt 119.0 lb

## 2014-01-25 DIAGNOSIS — R066 Hiccough: Secondary | ICD-10-CM

## 2014-01-25 DIAGNOSIS — E119 Type 2 diabetes mellitus without complications: Secondary | ICD-10-CM

## 2014-01-25 DIAGNOSIS — R634 Abnormal weight loss: Secondary | ICD-10-CM

## 2014-01-25 DIAGNOSIS — I10 Essential (primary) hypertension: Secondary | ICD-10-CM

## 2014-01-25 DIAGNOSIS — E785 Hyperlipidemia, unspecified: Secondary | ICD-10-CM

## 2014-01-25 NOTE — Progress Notes (Signed)
Pre visit review using our clinic review tool, if applicable. No additional management support is needed unless otherwise documented below in the visit note. 

## 2014-01-25 NOTE — Patient Instructions (Signed)
Hiccups °Hiccups are caused by a sudden contraction of the muscles between the ribs and the muscle under your lungs (diaphragm). When you hiccup, the top of your windpipe (glottis) closes immediately after your diaphragm contracts. This makes the typical 'hic' sound. A hiccup is a reflex that you cannot stop. Unlike other reflexes such as coughing and sneezing, hiccups do not seem to have any useful purpose. There are 3 types of hiccups:  °· Benign bouts: last less than 48 hours. °· Persistent: last more than 48 hours but less than 1 month. °· Intractable: last more than 1 month. °CAUSES  °Most people have bouts of hiccups from time to time. They start for no apparent reason, last a short while, and then stop. Sometimes they are due to: °· A temporary swollen stomach caused by overeating or eating too fast, eating spicy foods, drinking fizzy drinks, or swallowing air. °· A sudden change in temperature (very hot or cold foods or drinks, a cold shower). °· Drinking alcohol or using tobacco. °There are no particular tests used to diagnose hiccups. Hiccups are usually considered harmless and do not point to a serious medical condition. However, there can be underlying medical problems that may cause hiccups, such as pneumonia, diabetes, metabolic problems, tumors, abdominal infections or injuries, and neurologic problems. You must follow up with your caregiver if your symptoms persist or become a frequent problem. °TREATMENT  °· Most cases need no treatment. A bout of benign hiccups usually does not last long. °· Medicine is sometimes needed to stop persistent hiccups. Medicine may be given intravenously (IV) or by mouth. °· Hypnosis or acupuncture may be suggested. °· Surgery to affect the nerve that supplies the diaphragm may be tried in severe cases. °· Treatment of an underlying cause is needed in some cases. °HOME CARE INSTRUCTIONS  °Popular remedies that may stop a bout of hiccups include: °· Gargling ice  water. °· Swallowing granulated sugar. °· Biting on a lemon. °· Holding your breath, breathing fast, or breathing into a paper bag. °· Bearing down. °· Gasping after a sudden fright. °· Pulling your tongue gently. °· Distraction. °SEEK MEDICAL CARE IF:  °· Hiccups last for more than 48 hours. °· You are given medicine but your hiccups do not get better. °· New symptoms show up. °· You cannot sleep or eat due to the hiccups. °· You have unexpected weight loss. °· You have trouble breathing or swallowing. °· You develop severe pain in your abdomen or other areas. °· You develop numbness, tingling, or weakness. °Document Released: 01/10/2002 Document Revised: 01/24/2012 Document Reviewed: 12/23/2010 °[employer]® Patient Information ©2014 [employer]. ° °

## 2014-01-30 ENCOUNTER — Encounter: Payer: Self-pay | Admitting: Family Medicine

## 2014-01-30 DIAGNOSIS — R066 Hiccough: Secondary | ICD-10-CM

## 2014-01-30 HISTORY — DX: Hiccough: R06.6

## 2014-01-30 NOTE — Assessment & Plan Note (Addendum)
Improved on recheck. Patient declines increased medications. Reports good numbers elsewhere. No changes today, encouraged DASH diet

## 2014-01-30 NOTE — Assessment & Plan Note (Signed)
Patient reports increasing her protein intake and maintaining her appetite. Declines aggressive work up. Was sent home with the MOST form to consider how she would like to proceed in the future.

## 2014-01-30 NOTE — Assessment & Plan Note (Signed)
Tolerating Red Yeast Rice daily

## 2014-01-30 NOTE — Progress Notes (Signed)
Patient ID: Kathryn Collins, female   DOB: 1927-04-29, 80 y.o.   MRN: [id number] PURVI RUEHL [id number] [birth year] 01/30/2014      Progress Note-Follow Up  Subjective  Chief Complaint  Chief Complaint  Patient presents with  . Follow-up    5 week    HPI  Patient is a 80 year old female in today for routine medical care. She reports she is eating better and has increased her protein intake. She has an appetite she denies abdominal pain but she continues to lose weight. No diarrhea or vomiting. No fevers or chills. No bloody or tarry stool. Does complain of some mild soap to mid back pain. Patient is following closely with ophthalmology and is having trouble with her right eye. She describes discomfort. No visual changes. Notes occasional hiccups. She now just anxiety continues to plague her but overall she feels she's doing well. She reports her blood pressure is in the 1:30 to 150 range when she is not here.. No cp/palp/sob  Past Medical History  Diagnosis Date  . HTN (hypertension) 05/03/2011  . Diabetes mellitus 05/03/2011  . Overweight 05/03/2011  . Hearing loss of aging 05/03/2011  . Renal mass, left 05/03/2011  . Arthritis 05/03/2011  . Memory loss of unknown cause 05/05/2011  . Hyperlipidemia 07/20/2011  . SOM (serous otitis media) 02/16/2012  . Diabetes mellitus 06/20/2012  . Carotid artery disease 12/23/2012  . Glaucoma 12/23/2012  . Loss of weight 05/03/2011  . Hiccups 01/30/2014    occasional    Past Surgical History  Procedure Laterality Date  . Total nephrectomy  2000    left  . Carpal tunnel release      right  . Cholecystectomy    . Breast surgery      biopsy, benign  . Eye surgery  01-21-14    glaucoma and cataracts- left eye    Family History  Problem Relation Age of Onset  . Hypertension Mother   . Diabetes Sister     type 1  . Diabetes Brother   . Hypertension Brother   . Kidney disease Brother   . Diabetes Maternal Grandfather   . Heart disease  Paternal Grandfather   . Heart disease Brother   . Stroke Brother     History   Social History  . Marital Status: Widowed    Spouse Name: N/A    Number of Children: N/A  . Years of Education: N/A   Occupational History  . Not on file.   Social History Main Topics  . Smoking status: Never Smoker   . Smokeless tobacco: Never Used  . Alcohol Use: No  . Drug Use: No  . Sexual Activity: Not on file   Other Topics Concern  . Not on file   Social History Narrative  . No narrative on file    Current Outpatient Prescriptions on File Prior to Visit  Medication Sig Dispense Refill  . Bioflavonoid Products (ESTER C PO) Take 250 mg by mouth 2 (two) times daily. During the winter months      . Boswellia Serrata (BOSWELLIA PO) Take 1 tablet by mouth 3 (three) times daily with meals.       Marland Kitchen CALCIUM LACTATE PO Take 3 tablets by mouth at bedtime.        . Cod Liver Oil 5000-500 UNIT/5ML OIL 2 tsp po daily    0  . Cyanocobalamin (VITAMIN B-12 CR PO) Take 1 tablet by mouth daily.      Marland Kitchen  lisinopril (PRINIVIL,ZESTRIL) 5 MG tablet Take 1 tablet (5 mg total) by mouth daily.  30 tablet  3  . LUMIGAN 0.01 % SOLN       . magnesium (MAGTAB) 84 MG (7MEQ) TBCR SR tablet Take 1 tablet (84 mg total) by mouth daily.  120 tablet    . NON FORMULARY daily. Cataplex, Cataplex ACP, Betacol, Cataplex D, Cataplex B, A-F Betacol, Congaplex, Catalyn, A-F Betafood      . ONE TOUCH ULTRA TEST test strip TEST as directed UPTO four times a day  100 each  1  . Policosanol (LIPEX PO) Take 1 capsule by mouth 2 (two) times daily.        . Red Yeast Rice Extract (RED YEAST RICE PO) Take 1 tablet by mouth 2 (two) times daily.      . Zinc 10 MG LOZG Use as directed in the mouth or throat.       No current facility-administered medications on file prior to visit.    Allergies  Allergen Reactions  . Shrimp [Shellfish Allergy]     Unknown allergic reaction  . Sulfites     Unknown reaction    Review of  Systems  Review of Systems  Constitutional: Negative for fever and malaise/fatigue.  HENT: Negative for congestion.   Eyes: Positive for pain. Negative for blurred vision, double vision, photophobia and discharge.  Respiratory: Negative for shortness of breath.   Cardiovascular: Negative for chest pain, palpitations and leg swelling.  Gastrointestinal: Negative for nausea, abdominal pain and diarrhea.  Genitourinary: Negative for dysuria.  Musculoskeletal: Positive for back pain. Negative for falls.  Skin: Negative for rash.  Neurological: Negative for loss of consciousness and headaches.  Endo/Heme/Allergies: Negative for polydipsia.  Psychiatric/Behavioral: Negative for depression, suicidal ideas and memory loss. The patient is nervous/anxious. The patient does not have insomnia.     Objective  BP 162/84  Pulse 85  Temp(Src) 97.5 F (36.4 C) (Oral)  Ht 4' 10.5" (1.486 m)  Wt 119 lb 0.6 oz (53.996 kg)  BMI 24.45 kg/m2  SpO2 97%  Physical Exam  Physical Exam  Constitutional: She is oriented to person, place, and time and well-developed, well-nourished, and in no distress. No distress.  HENT:  Head: Normocephalic and atraumatic.  Eyes: Conjunctivae are normal.  Neck: Neck supple. No thyromegaly present.  Cardiovascular: Normal rate, regular rhythm and normal heart sounds.   Pulmonary/Chest: Effort normal and breath sounds normal. She has no wheezes.  Abdominal: She exhibits no distension and no mass.  Musculoskeletal: She exhibits no edema.  Lymphadenopathy:    She has no cervical adenopathy.  Neurological: She is alert and oriented to person, place, and time.  Skin: Skin is warm and dry. No rash noted. She is not diaphoretic.  Psychiatric: Memory, affect and judgment normal.    Lab Results  Component Value Date   TSH 1.138 10/18/2013   Lab Results  Component Value Date   WBC 11.5* 10/18/2013   HGB 14.5 10/18/2013   HCT 42.5 10/18/2013   MCV 88.7 10/18/2013   PLT  284 10/18/2013   Lab Results  Component Value Date   CREATININE 0.70 10/18/2013   BUN 15 10/18/2013   NA 136 10/18/2013   K 4.5 10/18/2013   CL 98 10/18/2013   CO2 28 10/18/2013   Lab Results  Component Value Date   ALT 17 10/18/2013   AST 45* 10/18/2013   ALKPHOS 92 10/18/2013   BILITOT 0.3 10/18/2013   Lab Results  Component  Value Date   CHOL 180 10/18/2013   Lab Results  Component Value Date   HDL 75 10/18/2013   Lab Results  Component Value Date   LDLCALC 71 10/18/2013   Lab Results  Component Value Date   TRIG 169* 10/18/2013   Lab Results  Component Value Date   CHOLHDL 2.4 10/18/2013     Assessment & Plan  HTN (hypertension) Improved on recheck. Patient declines increased medications. Reports good numbers elsewhere. No changes today, encouraged DASH diet  Loss of weight Patient reports increasing her protein intake and maintaining her appetite. Declines aggressive work up. Was sent home with the MOST form to consider how she would like to proceed in the future.  Diabetes mellitus Acceptable A1C. No changes  Hyperlipidemia Tolerating Red Yeast Rice daily

## 2014-01-30 NOTE — Assessment & Plan Note (Signed)
Acceptable A1C. No changes

## 2014-02-11 ENCOUNTER — Other Ambulatory Visit: Payer: Self-pay | Admitting: Family Medicine

## 2014-02-28 ENCOUNTER — Other Ambulatory Visit: Payer: Self-pay | Admitting: Family Medicine

## 2014-02-28 ENCOUNTER — Ambulatory Visit (INDEPENDENT_AMBULATORY_CARE_PROVIDER_SITE_OTHER): Payer: Commercial Managed Care - HMO | Admitting: Family Medicine

## 2014-02-28 ENCOUNTER — Encounter: Payer: Self-pay | Admitting: Family Medicine

## 2014-02-28 VITALS — BP 150/80 | HR 77 | Temp 97.9°F | Ht 58.5 in | Wt 115.0 lb

## 2014-02-28 DIAGNOSIS — R350 Frequency of micturition: Secondary | ICD-10-CM

## 2014-02-28 DIAGNOSIS — R198 Other specified symptoms and signs involving the digestive system and abdomen: Secondary | ICD-10-CM

## 2014-02-28 DIAGNOSIS — E119 Type 2 diabetes mellitus without complications: Secondary | ICD-10-CM

## 2014-02-28 DIAGNOSIS — R194 Change in bowel habit: Secondary | ICD-10-CM

## 2014-02-28 DIAGNOSIS — I10 Essential (primary) hypertension: Secondary | ICD-10-CM

## 2014-02-28 DIAGNOSIS — R630 Anorexia: Secondary | ICD-10-CM

## 2014-02-28 DIAGNOSIS — R634 Abnormal weight loss: Secondary | ICD-10-CM

## 2014-02-28 DIAGNOSIS — H269 Unspecified cataract: Secondary | ICD-10-CM

## 2014-02-28 LAB — HEPATIC FUNCTION PANEL
ALT: 17 U/L (ref 0–35)
AST: 82 U/L — ABNORMAL HIGH (ref 0–37)
Albumin: 4.2 g/dL (ref 3.5–5.2)
Alkaline Phosphatase: 144 U/L — ABNORMAL HIGH (ref 39–117)
BILIRUBIN INDIRECT: 0.3 mg/dL (ref 0.2–1.2)
Bilirubin, Direct: 0.1 mg/dL (ref 0.0–0.3)
TOTAL PROTEIN: 7.2 g/dL (ref 6.0–8.3)
Total Bilirubin: 0.4 mg/dL (ref 0.2–1.2)

## 2014-02-28 LAB — RENAL FUNCTION PANEL
Albumin: 4.2 g/dL (ref 3.5–5.2)
BUN: 17 mg/dL (ref 6–23)
CHLORIDE: 97 meq/L (ref 96–112)
CO2: 29 meq/L (ref 19–32)
Calcium: 9.9 mg/dL (ref 8.4–10.5)
Creat: 0.84 mg/dL (ref 0.50–1.10)
Glucose, Bld: 111 mg/dL — ABNORMAL HIGH (ref 70–99)
PHOSPHORUS: 4 mg/dL (ref 2.3–4.6)
Potassium: 4.3 mEq/L (ref 3.5–5.3)
Sodium: 134 mEq/L — ABNORMAL LOW (ref 135–145)

## 2014-02-28 LAB — CBC
HCT: 42.9 % (ref 36.0–46.0)
Hemoglobin: 14.8 g/dL (ref 12.0–15.0)
MCH: 30.3 pg (ref 26.0–34.0)
MCHC: 34.5 g/dL (ref 30.0–36.0)
MCV: 87.9 fL (ref 78.0–100.0)
PLATELETS: 234 10*3/uL (ref 150–400)
RBC: 4.88 MIL/uL (ref 3.87–5.11)
RDW: 13 % (ref 11.5–15.5)
WBC: 12.6 10*3/uL — AB (ref 4.0–10.5)

## 2014-02-28 LAB — T4, FREE: FREE T4: 1.43 ng/dL (ref 0.80–1.80)

## 2014-02-28 LAB — TSH: TSH: 1.782 u[IU]/mL (ref 0.350–4.500)

## 2014-02-28 NOTE — Patient Instructions (Signed)

## 2014-02-28 NOTE — Progress Notes (Signed)
Pre visit review using our clinic review tool, if applicable. No additional management support is needed unless otherwise documented below in the visit note. 

## 2014-03-01 LAB — URINALYSIS
Bilirubin Urine: NEGATIVE
Glucose, UA: NEGATIVE mg/dL
Hgb urine dipstick: NEGATIVE
KETONES UR: NEGATIVE mg/dL
LEUKOCYTES UA: NEGATIVE
NITRITE: NEGATIVE
PH: 6 (ref 5.0–8.0)
Protein, ur: 100 mg/dL — AB
SPECIFIC GRAVITY, URINE: 1.018 (ref 1.005–1.030)
UROBILINOGEN UA: 0.2 mg/dL (ref 0.0–1.0)

## 2014-03-01 LAB — HEMOGLOBIN A1C
Hgb A1c MFr Bld: 6.1 % — ABNORMAL HIGH (ref <5.7)
Mean Plasma Glucose: 128 mg/dL — ABNORMAL HIGH (ref <117)

## 2014-03-01 LAB — CULTURE, URINE COMPREHENSIVE
COLONY COUNT: NO GROWTH
Organism ID, Bacteria: NO GROWTH

## 2014-03-02 ENCOUNTER — Ambulatory Visit (HOSPITAL_BASED_OUTPATIENT_CLINIC_OR_DEPARTMENT_OTHER)
Admission: RE | Admit: 2014-03-02 | Discharge: 2014-03-02 | Disposition: A | Discharge status: Home / Self Care | Payer: Medicare HMO | Source: Ambulatory Visit | Attending: Family Medicine | Admitting: Family Medicine

## 2014-03-02 DIAGNOSIS — R634 Abnormal weight loss: Secondary | ICD-10-CM

## 2014-03-02 DIAGNOSIS — R109 Unspecified abdominal pain: Secondary | ICD-10-CM | POA: Insufficient documentation

## 2014-03-02 DIAGNOSIS — Z85528 Personal history of other malignant neoplasm of kidney: Secondary | ICD-10-CM | POA: Insufficient documentation

## 2014-03-02 DIAGNOSIS — R630 Anorexia: Secondary | ICD-10-CM

## 2014-03-02 DIAGNOSIS — I10 Essential (primary) hypertension: Secondary | ICD-10-CM

## 2014-03-02 DIAGNOSIS — R194 Change in bowel habit: Secondary | ICD-10-CM

## 2014-03-03 ENCOUNTER — Encounter: Payer: Self-pay | Admitting: Family Medicine

## 2014-03-03 DIAGNOSIS — H269 Unspecified cataract: Secondary | ICD-10-CM

## 2014-03-03 HISTORY — DX: Unspecified cataract: H26.9

## 2014-03-03 NOTE — Progress Notes (Signed)
Patient ID: Kathryn Collins, female   DOB: [birth year], 80 y.o.   MRN: [id number] Kathryn Collins [id number] 02-05-1927 03/03/2014      Progress Note-Follow Up  Subjective  Chief Complaint  Chief Complaint  Patient presents with  . pt is requesting an ultrasound    on stomach and left kidney    HPI  Patient is a 80 year old female in today for routine medical care. Is in today to discuss further imaging due to persistent weight loss, despite her increased protein intake. She acknowledges poor appetite but denies any pain otherwise, bowels are moving. No other new concerns. Denies CP/palp/SOB/HA/congestion/fevers/GI or GU c/o. Taking meds as prescribed  Past Medical History  Diagnosis Date  . HTN (hypertension) 05/03/2011  . Diabetes mellitus 05/03/2011  . Overweight 05/03/2011  . Hearing loss of aging 05/03/2011  . Renal mass, left 05/03/2011  . Arthritis 05/03/2011  . Memory loss of unknown cause 05/05/2011  . Hyperlipidemia 07/20/2011  . SOM (serous otitis media) 02/16/2012  . Diabetes mellitus 06/20/2012  . Carotid artery disease 12/23/2012  . Glaucoma 12/23/2012  . Loss of weight 05/03/2011  . Hiccups 01/30/2014    occasional  . Cataract 03/03/2014    Past Surgical History  Procedure Laterality Date  . Total nephrectomy  2000    left  . Carpal tunnel release      right  . Cholecystectomy    . Breast surgery      biopsy, benign  . Eye surgery  01-21-14    glaucoma and cataracts- left eye    Family History  Problem Relation Age of Onset  . Hypertension Mother   . Diabetes Sister     type 1  . Diabetes Brother   . Hypertension Brother   . Kidney disease Brother   . Diabetes Maternal Grandfather   . Heart disease Paternal Grandfather   . Heart disease Brother   . Stroke Brother     History   Social History  . Marital Status: Widowed    Spouse Name: N/A    Number of Children: N/A  . Years of Education: N/A   Occupational History  . Not on file.   Social History  Main Topics  . Smoking status: Never Smoker   . Smokeless tobacco: Never Used  . Alcohol Use: No  . Drug Use: No  . Sexual Activity: Not on file   Other Topics Concern  . Not on file   Social History Narrative  . No narrative on file    Current Outpatient Prescriptions on File Prior to Visit  Medication Sig Dispense Refill  . Besifloxacin HCl (BESIVANCE) 0.6 % SUSP Apply 1 drop to eye 3 (three) times daily. Left eye      . Bioflavonoid Products (ESTER C PO) Take 250 mg by mouth 2 (two) times daily. During the winter months      . Boswellia Serrata (BOSWELLIA PO) Take 1 tablet by mouth 3 (three) times daily with meals.       . bromfenac (XIBROM) 0.09 % ophthalmic solution Place 1 drop into the left eye 2 (two) times daily.      Marland Kitchen CALCIUM LACTATE PO Take 3 tablets by mouth at bedtime.        . Cod Liver Oil 5000-500 UNIT/5ML OIL 2 tsp po daily    0  . Cyanocobalamin (VITAMIN B-12 CR PO) Take 1 tablet by mouth daily.      . Difluprednate (DUREZOL) 0.05 % EMUL Apply  1 drop to eye 3 (three) times daily. Left eye      . lisinopril (PRINIVIL,ZESTRIL) 5 MG tablet Take 1 tablet (5 mg total) by mouth daily.  30 tablet  3  . LUMIGAN 0.01 % SOLN       . magnesium (MAGTAB) 84 MG (7MEQ) TBCR SR tablet Take 1 tablet (84 mg total) by mouth daily.  120 tablet    . NON FORMULARY daily. Cataplex, Cataplex ACP, Betacol, Cataplex D, Cataplex B, A-F Betacol, Congaplex, Catalyn, A-F Betafood      . ONE TOUCH ULTRA TEST test strip TEST as directed UP TO four times a day  100 each  1  . Policosanol (LIPEX PO) Take 1 capsule by mouth 2 (two) times daily.        Marland Kitchen PRESCRIPTION MEDICATION Brimmidine tid- right eye      . Red Yeast Rice Extract (RED YEAST RICE PO) Take 1 tablet by mouth 2 (two) times daily.      . Timolol Maleate (ISTALOL) 0.5 % (DAILY) SOLN Apply to eye daily. Both eyes      . Zinc 10 MG LOZG Use as directed in the mouth or throat.       No current facility-administered medications on file  prior to visit.    Allergies  Allergen Reactions  . Shrimp [Shellfish Allergy]     Unknown allergic reaction  . Sulfites     Unknown reaction    Review of Systems  Review of Systems  Constitutional: Positive for malaise/fatigue. Negative for fever.  HENT: Negative for congestion.   Eyes: Negative for discharge.  Respiratory: Negative for shortness of breath.   Cardiovascular: Negative for chest pain, palpitations and leg swelling.  Gastrointestinal: Negative for nausea, abdominal pain and diarrhea.  Genitourinary: Negative for dysuria.  Musculoskeletal: Negative for falls.  Skin: Negative for rash.  Neurological: Negative for loss of consciousness and headaches.  Endo/Heme/Allergies: Negative for polydipsia.  Psychiatric/Behavioral: Negative for depression and suicidal ideas. The patient is not nervous/anxious and does not have insomnia.     Objective  BP 150/80  Pulse 77  Temp(Src) 97.9 F (36.6 C) (Oral)  Ht 4' 10.5" (1.486 m)  Wt 115 lb (52.164 kg)  BMI 23.62 kg/m2  SpO2 99%  Physical Exam  Physical Exam  Constitutional: She is oriented to person, place, and time and well-developed, well-nourished, and in no distress. No distress.  HENT:  Head: Normocephalic and atraumatic.  Eyes: Conjunctivae are normal.  Neck: Neck supple. No thyromegaly present.  Cardiovascular: Normal rate, regular rhythm and normal heart sounds.   No murmur heard. Pulmonary/Chest: Effort normal and breath sounds normal. She has no wheezes.  Abdominal: She exhibits no distension and no mass.  Musculoskeletal: She exhibits no edema.  Lymphadenopathy:    She has no cervical adenopathy.  Neurological: She is alert and oriented to person, place, and time.  Skin: Skin is warm and dry. No rash noted. She is not diaphoretic.  Psychiatric: Memory, affect and judgment normal.    Lab Results  Component Value Date   TSH 1.782 02/28/2014   Lab Results  Component Value Date   WBC 12.6*  02/28/2014   HGB 14.8 02/28/2014   HCT 42.9 02/28/2014   MCV 87.9 02/28/2014   PLT 234 02/28/2014   Lab Results  Component Value Date   CREATININE 0.84 02/28/2014   BUN 17 02/28/2014   NA 134* 02/28/2014   K 4.3 02/28/2014   CL 97 02/28/2014   CO2 29  02/28/2014   Lab Results  Component Value Date   ALT 17 02/28/2014   AST 82* 02/28/2014   ALKPHOS 144* 02/28/2014   BILITOT 0.4 02/28/2014   Lab Results  Component Value Date   CHOL 180 10/18/2013   Lab Results  Component Value Date   HDL 75 10/18/2013   Lab Results  Component Value Date   LDLCALC 71 10/18/2013   Lab Results  Component Value Date   TRIG 169* 10/18/2013   Lab Results  Component Value Date   CHOLHDL 2.4 10/18/2013     Assessment & Plan  Loss of weight Previously discussed further work up but she had declined. Today she has agreed to imaging and ultrasound shows some liver lesions concerning for cancer. Will need further imaging if patient is agreeable to further evaluate. Encouraged ongoing po intake and protein for now.  HTN (hypertension) Well controlled, no changes to meds. Encouraged heart healthy diet such as the DASH diet and exercise as tolerated.   Cataract Has surgery on 03/04/14

## 2014-03-03 NOTE — Assessment & Plan Note (Signed)
Previously discussed further work up but she had declined. Today she has agreed to imaging and ultrasound shows some liver lesions concerning for cancer. Will need further imaging if patient is agreeable to further evaluate. Encouraged ongoing po intake and protein for now.

## 2014-03-03 NOTE — Assessment & Plan Note (Signed)
Has surgery on 03/04/14

## 2014-03-03 NOTE — Assessment & Plan Note (Signed)
Well controlled, no changes to meds. Encouraged heart healthy diet such as the DASH diet and exercise as tolerated.  °

## 2014-03-04 ENCOUNTER — Other Ambulatory Visit: Payer: Self-pay | Admitting: Family Medicine

## 2014-03-04 DIAGNOSIS — R634 Abnormal weight loss: Secondary | ICD-10-CM

## 2014-03-04 DIAGNOSIS — K769 Liver disease, unspecified: Secondary | ICD-10-CM

## 2014-03-08 ENCOUNTER — Other Ambulatory Visit (HOSPITAL_BASED_OUTPATIENT_CLINIC_OR_DEPARTMENT_OTHER): Payer: Commercial Managed Care - HMO

## 2014-03-09 ENCOUNTER — Ambulatory Visit (HOSPITAL_BASED_OUTPATIENT_CLINIC_OR_DEPARTMENT_OTHER)
Admission: RE | Admit: 2014-03-09 | Discharge: 2014-03-09 | Disposition: A | Discharge status: Home / Self Care | Payer: Medicare HMO | Source: Ambulatory Visit | Attending: Family Medicine | Admitting: Family Medicine

## 2014-03-09 DIAGNOSIS — R634 Abnormal weight loss: Secondary | ICD-10-CM

## 2014-03-09 DIAGNOSIS — K769 Liver disease, unspecified: Secondary | ICD-10-CM

## 2014-03-09 DIAGNOSIS — K7689 Other specified diseases of liver: Secondary | ICD-10-CM | POA: Insufficient documentation

## 2014-03-09 MED ORDER — IOHEXOL 300 MG/ML  SOLN
80.0000 mL | Freq: Once | INTRAMUSCULAR | Status: AC | PRN
  Administered 2014-03-09: 80 mL via INTRAVENOUS

## 2014-03-11 ENCOUNTER — Other Ambulatory Visit (HOSPITAL_BASED_OUTPATIENT_CLINIC_OR_DEPARTMENT_OTHER): Payer: Commercial Managed Care - HMO

## 2014-03-18 ENCOUNTER — Telehealth: Payer: Self-pay | Admitting: Family Medicine

## 2014-03-18 ENCOUNTER — Ambulatory Visit (INDEPENDENT_AMBULATORY_CARE_PROVIDER_SITE_OTHER): Payer: Commercial Managed Care - HMO | Admitting: Family Medicine

## 2014-03-18 ENCOUNTER — Encounter: Payer: Self-pay | Admitting: Family Medicine

## 2014-03-18 VITALS — BP 164/86 | HR 115 | Temp 97.6°F | Ht 58.5 in | Wt 114.1 lb

## 2014-03-18 DIAGNOSIS — K7689 Other specified diseases of liver: Secondary | ICD-10-CM

## 2014-03-18 DIAGNOSIS — K769 Liver disease, unspecified: Secondary | ICD-10-CM

## 2014-03-18 DIAGNOSIS — E119 Type 2 diabetes mellitus without complications: Secondary | ICD-10-CM

## 2014-03-18 DIAGNOSIS — N289 Disorder of kidney and ureter, unspecified: Secondary | ICD-10-CM

## 2014-03-18 DIAGNOSIS — C221 Intrahepatic bile duct carcinoma: Secondary | ICD-10-CM

## 2014-03-18 DIAGNOSIS — R634 Abnormal weight loss: Secondary | ICD-10-CM

## 2014-03-18 DIAGNOSIS — N2889 Other specified disorders of kidney and ureter: Secondary | ICD-10-CM

## 2014-03-18 DIAGNOSIS — I10 Essential (primary) hypertension: Secondary | ICD-10-CM

## 2014-03-18 HISTORY — DX: Liver disease, unspecified: K76.9

## 2014-03-18 NOTE — Telephone Encounter (Signed)
Pt was informed and voiced understanding to be here at 4:15 pm

## 2014-03-18 NOTE — Assessment & Plan Note (Signed)
She appears to have renal cysts on CT scan on 03/10/14.

## 2014-03-18 NOTE — Progress Notes (Signed)
Pre visit review using our clinic review tool, if applicable. No additional management support is needed unless otherwise documented below in the visit note. 

## 2014-03-18 NOTE — Assessment & Plan Note (Addendum)
Numerous lesions throughout the liver, most concerning for  metastatic disease. Primary lesion such as intrahepatic  cholangiocarcinoma may be an additional consideration. No other  evidence of metastatic disease in the remainder of the abdomen or  Pelvis on 03/10/14. She is aware of the possiblity of cancer. Will set up urgent consultation with oncology and proceed with any imaging or labs they recommend prior to that visit to help staging.

## 2014-03-18 NOTE — Progress Notes (Signed)
Patient ID: Kathryn Collins, female   DOB: 09-Jan-1927, 80 y.o.   MRN: [id number] MAIRI STAGLIANO [id number] 08/27/1927 03/18/2014      Progress Note-Follow Up  Subjective  Chief Complaint  Chief Complaint  Patient presents with  . discuss CT scan results    HPI  Patient is a 80 year old female in today for routine medical care. She is in today to review her recent CAT scan results. She continues T. regularly but acknowledges poor appetite. Unfortunately continues to lose weight. Denies any abdominal pain, nausea vomiting or change in bowel habits. She denies any other acute complaints. No cp/palp/sob. Her fatigue and weakness have been present for awhile.  Past Medical History  Diagnosis Date  . HTN (hypertension) 05/03/2011  . Diabetes mellitus 05/03/2011  . Overweight 05/03/2011  . Hearing loss of aging 05/03/2011  . Renal mass, left 05/03/2011  . Arthritis 05/03/2011  . Memory loss of unknown cause 05/05/2011  . Hyperlipidemia 07/20/2011  . SOM (serous otitis media) 02/16/2012  . Diabetes mellitus 06/20/2012  . Carotid artery disease 12/23/2012  . Glaucoma 12/23/2012  . Loss of weight 05/03/2011  . Hiccups 01/30/2014    occasional  . Cataract 03/03/2014  . Hepatic lesion 03/18/2014    Past Surgical History  Procedure Laterality Date  . Total nephrectomy  2000    left  . Carpal tunnel release      right  . Cholecystectomy    . Breast surgery      biopsy, benign  . Eye surgery  01-21-14    glaucoma and cataracts- left eye    Family History  Problem Relation Age of Onset  . Hypertension Mother   . Diabetes Sister     type 1  . Diabetes Brother   . Hypertension Brother   . Kidney disease Brother   . Diabetes Maternal Grandfather   . Heart disease Paternal Grandfather   . Heart disease Brother   . Stroke Brother     History   Social History  . Marital Status: Widowed    Spouse Name: N/A    Number of Children: N/A  . Years of Education: N/A   Occupational History  .  Not on file.   Social History Main Topics  . Smoking status: Never Smoker   . Smokeless tobacco: Never Used  . Alcohol Use: No  . Drug Use: No  . Sexual Activity: Not on file   Other Topics Concern  . Not on file   Social History Narrative  . No narrative on file    Current Outpatient Prescriptions on File Prior to Visit  Medication Sig Dispense Refill  . Besifloxacin HCl (BESIVANCE) 0.6 % SUSP Apply 1 drop to eye 3 (three) times daily. Left eye      . Bioflavonoid Products (ESTER C PO) Take 250 mg by mouth 2 (two) times daily. During the winter months      . Boswellia Serrata (BOSWELLIA PO) Take 1 tablet by mouth 3 (three) times daily with meals.       . bromfenac (XIBROM) 0.09 % ophthalmic solution Place 1 drop into the left eye 2 (two) times daily.      Marland Kitchen CALCIUM LACTATE PO Take 3 tablets by mouth at bedtime.        . Cod Liver Oil 5000-500 UNIT/5ML OIL 2 tsp po daily    0  . Difluprednate (DUREZOL) 0.05 % EMUL Apply 1 drop to eye 3 (three) times daily. Left eye      .  lisinopril (PRINIVIL,ZESTRIL) 5 MG tablet Take 1 tablet (5 mg total) by mouth daily.  30 tablet  3  . LUMIGAN 0.01 % SOLN       . magnesium (MAGTAB) 84 MG (7MEQ) TBCR SR tablet Take 1 tablet (84 mg total) by mouth daily.  120 tablet    . NON FORMULARY daily. Cataplex, Cataplex ACP, Betacol, Cataplex D, Cataplex B, A-F Betacol, Congaplex, Catalyn, A-F Betafood      . ONE TOUCH ULTRA TEST test strip TEST as directed UP TO four times a day  100 each  1  . Policosanol (LIPEX PO) Take 1 capsule by mouth 2 (two) times daily.        Marland Kitchen PRESCRIPTION MEDICATION Brimmidine tid- right eye      . Red Yeast Rice Extract (RED YEAST RICE PO) Take 1 tablet by mouth 2 (two) times daily.      . Timolol Maleate (ISTALOL) 0.5 % (DAILY) SOLN Apply to eye daily. Both eyes      . Zinc 10 MG LOZG Use as directed in the mouth or throat.       No current facility-administered medications on file prior to visit.    Allergies  Allergen  Reactions  . Shrimp [Shellfish Allergy]     Unknown allergic reaction  . Sulfites     Unknown reaction    Review of Systems  Review of Systems  Constitutional: Positive for malaise/fatigue. Negative for fever.  HENT: Negative for congestion.   Eyes: Negative for discharge.  Respiratory: Negative for shortness of breath.   Cardiovascular: Negative for chest pain, palpitations and leg swelling.  Gastrointestinal: Negative for nausea, abdominal pain and diarrhea.  Genitourinary: Negative for dysuria.  Musculoskeletal: Negative for falls.  Skin: Negative for rash.  Neurological: Positive for weakness. Negative for loss of consciousness and headaches.  Endo/Heme/Allergies: Negative for polydipsia.  Psychiatric/Behavioral: Negative for depression and suicidal ideas. The patient is not nervous/anxious and does not have insomnia.     Objective  BP 164/86  Pulse 115  Temp(Src) 97.6 F (36.4 C) (Oral)  Ht 4' 10.5" (1.486 m)  Wt 114 lb 1.9 oz (51.764 kg)  BMI 23.44 kg/m2  SpO2 96%  Physical Exam  Physical Exam  Constitutional: She is oriented to person, place, and time and well-developed, well-nourished, and in no distress. No distress.  HENT:  Head: Normocephalic and atraumatic.  Eyes: Conjunctivae are normal.  Neck: Neck supple. No thyromegaly present.  Cardiovascular: Normal rate, regular rhythm and normal heart sounds.   No murmur heard. Pulmonary/Chest: Effort normal and breath sounds normal. She has no wheezes.  Abdominal: She exhibits no distension and no mass.  Musculoskeletal: She exhibits no edema.  Lymphadenopathy:    She has no cervical adenopathy.  Neurological: She is alert and oriented to person, place, and time.  Skin: Skin is warm and dry. No rash noted. She is not diaphoretic.  Psychiatric: Memory, affect and judgment normal.    Lab Results  Component Value Date   TSH 1.782 02/28/2014   Lab Results  Component Value Date   WBC 12.6* 02/28/2014   HGB  14.8 02/28/2014   HCT 42.9 02/28/2014   MCV 87.9 02/28/2014   PLT 234 02/28/2014   Lab Results  Component Value Date   CREATININE 0.84 02/28/2014   BUN 17 02/28/2014   NA 134* 02/28/2014   K 4.3 02/28/2014   CL 97 02/28/2014   CO2 29 02/28/2014   Lab Results  Component Value Date  ALT 17 02/28/2014   AST 82* 02/28/2014   ALKPHOS 144* 02/28/2014   BILITOT 0.4 02/28/2014   Lab Results  Component Value Date   CHOL 180 10/18/2013   Lab Results  Component Value Date   HDL 75 10/18/2013   Lab Results  Component Value Date   LDLCALC 71 10/18/2013   Lab Results  Component Value Date   TRIG 169* 10/18/2013   Lab Results  Component Value Date   CHOLHDL 2.4 10/18/2013     Assessment & Plan  HTN (hypertension) Well controlled, no changes to meds. Encouraged heart healthy diet such as the DASH diet and exercise as tolerated.   Renal mass, left She appears to have renal cysts on CT scan on 03/10/14.   Hepatic lesion Numerous lesions throughout the liver, most concerning for  metastatic disease. Primary lesion such as intrahepatic  cholangiocarcinoma may be an additional consideration. No other  evidence of metastatic disease in the remainder of the abdomen or  Pelvis on 03/10/14. She is aware of the possiblity of cancer. Will set up urgent consultation with oncology and proceed with any imaging or labs they recommend prior to that visit to help staging.    Loss of weight Eating well likely related to a cancerous project at this time.  Diabetes mellitus She is encouraged to eat what ever she can tolerate at this time.

## 2014-03-18 NOTE — Assessment & Plan Note (Signed)
She is encouraged to eat what ever she can tolerate at this time.

## 2014-03-18 NOTE — Telephone Encounter (Signed)
Please advise? Did these get released to mychart? It looks like it has?

## 2014-03-18 NOTE — Assessment & Plan Note (Signed)
Well controlled, no changes to meds. Encouraged heart healthy diet such as the DASH diet and exercise as tolerated.  °

## 2014-03-18 NOTE — Assessment & Plan Note (Signed)
Eating well likely related to a cancerous project at this time.

## 2014-03-18 NOTE — Patient Instructions (Signed)
See if any one can go with you when you see Dr Marin Olp of oncology

## 2014-03-18 NOTE — Telephone Encounter (Signed)
Patient is requesting CT results.

## 2014-03-18 NOTE — Telephone Encounter (Signed)
Per Dr Charlett Blake have patient come in at 4:15pm. Patient has difficulty hearing and understanding and it would be easier to have pt in the office.

## 2014-03-20 ENCOUNTER — Telehealth: Payer: Self-pay | Admitting: Hematology & Oncology

## 2014-03-20 NOTE — Telephone Encounter (Signed)
I spoke w NEW PATIENT today to remind them of their appointment with Dr. Ennever. Also, advised them to bring all meds and insurance information. ° °

## 2014-03-21 ENCOUNTER — Ambulatory Visit: Payer: Commercial Managed Care - HMO

## 2014-03-21 ENCOUNTER — Encounter: Payer: Self-pay | Admitting: Hematology & Oncology

## 2014-03-21 ENCOUNTER — Telehealth: Payer: Self-pay | Admitting: Hematology & Oncology

## 2014-03-21 ENCOUNTER — Ambulatory Visit (HOSPITAL_BASED_OUTPATIENT_CLINIC_OR_DEPARTMENT_OTHER): Payer: Commercial Managed Care - HMO | Admitting: Hematology & Oncology

## 2014-03-21 ENCOUNTER — Other Ambulatory Visit (HOSPITAL_BASED_OUTPATIENT_CLINIC_OR_DEPARTMENT_OTHER): Payer: Commercial Managed Care - HMO | Admitting: Lab

## 2014-03-21 VITALS — BP 149/75 | HR 77 | Temp 97.9°F | Resp 14 | Ht 59.0 in | Wt 113.0 lb

## 2014-03-21 DIAGNOSIS — C221 Intrahepatic bile duct carcinoma: Secondary | ICD-10-CM

## 2014-03-21 LAB — CMP (CANCER CENTER ONLY)
ALK PHOS: 157 U/L — AB (ref 26–84)
ALT(SGPT): 18 U/L (ref 10–47)
AST: 97 U/L — ABNORMAL HIGH (ref 11–38)
Albumin: 3.5 g/dL (ref 3.3–5.5)
BILIRUBIN TOTAL: 0.7 mg/dL (ref 0.20–1.60)
BUN: 20 mg/dL (ref 7–22)
CO2: 32 meq/L (ref 18–33)
CREATININE: 0.9 mg/dL (ref 0.6–1.2)
Calcium: 10.6 mg/dL — ABNORMAL HIGH (ref 8.0–10.3)
Chloride: 96 mEq/L — ABNORMAL LOW (ref 98–108)
Glucose, Bld: 120 mg/dL — ABNORMAL HIGH (ref 73–118)
Potassium: 4.3 mEq/L (ref 3.3–4.7)
Sodium: 136 mEq/L (ref 128–145)
Total Protein: 7.7 g/dL (ref 6.4–8.1)

## 2014-03-21 LAB — CBC WITH DIFFERENTIAL (CANCER CENTER ONLY)
BASO#: 0 10*3/uL (ref 0.0–0.2)
BASO%: 0.3 % (ref 0.0–2.0)
EOS%: 0.3 % (ref 0.0–7.0)
Eosinophils Absolute: 0 10*3/uL (ref 0.0–0.5)
HCT: 43.4 % (ref 34.8–46.6)
HEMOGLOBIN: 14.9 g/dL (ref 11.6–15.9)
LYMPH#: 1.5 10*3/uL (ref 0.9–3.3)
LYMPH%: 10.6 % — ABNORMAL LOW (ref 14.0–48.0)
MCH: 30.4 pg (ref 26.0–34.0)
MCHC: 34.3 g/dL (ref 32.0–36.0)
MCV: 89 fL (ref 81–101)
MONO#: 1.4 10*3/uL — ABNORMAL HIGH (ref 0.1–0.9)
MONO%: 9.8 % (ref 0.0–13.0)
NEUT%: 79 % (ref 39.6–80.0)
NEUTROS ABS: 11.2 10*3/uL — AB (ref 1.5–6.5)
PLATELETS: 202 10*3/uL (ref 145–400)
RBC: 4.9 10*6/uL (ref 3.70–5.32)
RDW: 13 % (ref 11.1–15.7)
WBC: 14.1 10*3/uL — ABNORMAL HIGH (ref 3.9–10.0)

## 2014-03-21 LAB — CEA: CEA: 1.2 ng/mL (ref 0.0–5.0)

## 2014-03-21 LAB — PREALBUMIN: Prealbumin: 15.6 mg/dL — ABNORMAL LOW (ref 17.0–34.0)

## 2014-03-21 LAB — CANCER ANTIGEN 19-9: CA 19 9: 199.8 U/mL — AB (ref <35.0)

## 2014-03-21 LAB — LACTATE DEHYDROGENASE: LDH: 222 U/L (ref 94–250)

## 2014-03-21 LAB — AFP TUMOR MARKER: AFP-Tumor Marker: 20.8 ng/mL — ABNORMAL HIGH (ref 0.0–8.0)

## 2014-03-21 NOTE — Progress Notes (Signed)
Referral MD  Reason for Referral: Extensive hepatic metastasis   Chief Complaint  Patient presents with  . NEW PATIENT  : I'm here because of my cancer  HPI: Ms. Lanius is a very charming 80 year old white female. She has been in fairly decent shape. She has noticed that she's had some weight loss over the past few months. She think she is probably lost about 40 pounds over the past few months.  Her appetite is down. She just does not want to eat that much.  She was seen by her family doctor. She had a ultrasound done. This was abnormal. She then had a CT scan of the abdomen. This, unfortunately, showed numerous lesions throughout the liver. No other abnormalities were noted outside the liver. Pancreas looked okay. She had a left kidney taken out. There is no lymphadenopathy. The intestines appeared okay. No pelvic mass was noted. She had no bony metastases.  She was coming referred to [place] for evaluation.  Again, she is weaker. She lives by herself. She has a lot of rescue cats that she is worried about.  Her son came in with her.  She's not smoke. She does not drink. She is no obvious occupational exposures.  There's no pain. She's had no nausea vomiting. She's had no change in bowel or bladder habits. She's had no rashes.   Past Medical History  Diagnosis Date  . HTN (hypertension) 05/03/2011  . Diabetes mellitus 05/03/2011  . Overweight 05/03/2011  . Hearing loss of aging 05/03/2011  . Renal mass, left 05/03/2011  . Arthritis 05/03/2011  . Memory loss of unknown cause 05/05/2011  . Hyperlipidemia 07/20/2011  . SOM (serous otitis media) 02/16/2012  . Diabetes mellitus 06/20/2012  . Carotid artery disease 12/23/2012  . Glaucoma 12/23/2012  . Loss of weight 05/03/2011  . Hiccups 01/30/2014    occasional  . Cataract 03/03/2014  . Hepatic lesion 03/18/2014  :  Past Surgical History  Procedure Laterality Date  . Total nephrectomy  2000    left  . Carpal tunnel release      right  .  Cholecystectomy    . Breast surgery      biopsy, benign  . Eye surgery  01-21-14    glaucoma and cataracts- left eye  :  Current outpatient prescriptions:Besifloxacin HCl (BESIVANCE) 0.6 % SUSP, Apply 1 drop to eye 3 (three) times daily. Left eye, Disp: , Rfl: ;  Bioflavonoid Products (ESTER C PO), Take 250 mg by mouth 2 (two) times daily. During the winter months, Disp: , Rfl: ;  Boswellia Serrata (BOSWELLIA PO), Take 1 tablet by mouth 3 (three) times daily with meals. , Disp: , Rfl:  bromfenac (XIBROM) 0.09 % ophthalmic solution, Place 1 drop into the left eye 2 (two) times daily., Disp: , Rfl: ;  CALCIUM LACTATE PO, Take 3 tablets by mouth at bedtime.  , Disp: , Rfl: ;  Cod Liver Oil 5000-500 UNIT/5ML OIL, 2 tsp po daily, Disp: , Rfl: 0;  Cyanocobalamin (VITAMIN B 12) 100 MCG LOZG, Take by mouth every morning., Disp: , Rfl:  Difluprednate (DUREZOL) 0.05 % EMUL, Apply 1 drop to eye 3 (three) times daily. Left eye, Disp: , Rfl: ;  lisinopril (PRINIVIL,ZESTRIL) 5 MG tablet, Take 5 mg by mouth daily. 03-21-14  PT STOPPED TAKING ON HER OWN, Disp: , Rfl: ;  LUMIGAN 0.01 % SOLN, , Disp: , Rfl: ;  magnesium (MAGTAB) 84 MG (7MEQ) TBCR SR tablet, Take 1 tablet (84 mg total) by mouth  daily., Disp: 120 tablet, Rfl:  NON FORMULARY, daily. Cataplex, Cataplex ACP, Betacol, Cataplex D, Cataplex B, A-F Betacol, Congaplex, Catalyn, A-F Betafood, Disp: , Rfl: ;  ONE TOUCH ULTRA TEST test strip, TEST as directed UP TO four times a day, Disp: 100 each, Rfl: 1;  Policosanol (LIPEX PO), Take 1 capsule by mouth 2 (two) times daily.  , Disp: , Rfl: ;  PRESCRIPTION MEDICATION, Place into both eyes every morning. Brimmidine tid- right eye, Disp: , Rfl:  Red Yeast Rice Extract (RED YEAST RICE PO), Take 1 tablet by mouth 2 (two) times daily., Disp: , Rfl: ;  Timolol Maleate (ISTALOL) 0.5 % (DAILY) SOLN, Apply to eye daily. Both eyes, Disp: , Rfl: ;  Zinc 10 MG LOZG, Use as directed in the mouth or throat., Disp: , Rfl:  :  :  Allergies  Allergen Reactions  . Shrimp [Shellfish Allergy]     Unknown allergic reaction  . Sulfites     Unknown reaction  :  Family History  Problem Relation Age of Onset  . Hypertension Mother   . Diabetes Sister     type 1  . Diabetes Brother   . Hypertension Brother   . Kidney disease Brother   . Diabetes Maternal Grandfather   . Heart disease Paternal Grandfather   . Heart disease Brother   . Stroke Brother   :  History   Social History  . Marital Status: Widowed    Spouse Name: N/A    Number of Children: N/A  . Years of Education: N/A   Occupational History  . Not on file.   Social History Main Topics  . Smoking status: Never Smoker   . Smokeless tobacco: Never Used     Comment: never used tobacco  . Alcohol Use: No  . Drug Use: No  . Sexual Activity: Not on file   Other Topics Concern  . Not on file   Social History Narrative  . No narrative on file  :  Pertinent items are noted in HPI.  Exam: $Remo'@[email or web address]'[id number]$ @  petite, elderly, somewhat frail white female. Vital signs show temperature of 97.9. Pulse 77. Blood pressure 149/75. Weight 113 pounds. Her head exam shows no lymphadenopathy. There is no scleral icterus. She has no oral lesions. Lungs are clear. Cardiac exam regular rate and rhythm. Abdomen soft. She is a decent bowel sounds. There is no fluid wave. There is no palpable pedal megaly. Back exam no tenderness over the spine. Extremities shows no clubbing cyanosis or edema. Distal muscle atrophy in upper and lower extremities. Skin exam no jaundice. Neurological exam no focal deficits.    Recent Labs  03/21/14 1249  WBC 14.1*  HGB 14.9  HCT 43.4  PLT 202    Recent Labs  03/21/14 1249  NA 136  K 4.3  CL 96*  CO2 32  GLUCOSE 120*  BUN 20  CREATININE 0.9  CALCIUM 10.6*    Blood smear review: None  Pathology: None     Assessment and Plan: Ms. Kathryn Collins is an 80 year old white female. She clearly has extensive hepatic  metastasis. The site is unknown. However, I really don't think that this is going to be an issue.  Her performance status is ECOG 2-3. I don't think that systemic chemotherapy is going to be a benefit to her. I would think that whatever we found would probably be a gastrointestinal primary. That being said, I think chemotherapy would have about a 10% chance at best of helping her.  I think that side effects would clearly outweigh benefit for her.  The weight loss is clearly the key for her.  I spoke to her and her son at length. I explained to them what I thought was going on. I told her that I just do not think that she would benefit from chemotherapy. She understands this and certainly has no problems with this.  I think hospice would definitely be a option for her. I think hospice would be the best option for her.  I told her that I thought that by the end of this month, that she likely would be met needing to go to a hospice facility. I do think that she will continue to lose weight as her appetite decreases.  Her calcium is up a little bit. This could be a prognostic factor that is negative.  I spoke to her and her son about end-of-life issues. She does not want to be kept alive on machines. She understands that if she were to be put on life support that she would not come off. I think that this would not help her nor her family. As such, she is a DO NOT RESUSCITATE.  We will get hospice to see her. We'll see back in than out either tomorrow or the weekend.  Again, I would think that she will end up had a hospice home in about 4 weeks. I think that she makes it more than 6-8 weeks, I would be surprised.  I answered all her questions. Her son was obviously upset. He did not say too much. He did not have a lot of questions to ask.  Am not sure we really have to get her back to the office. I the hospice will do a great job in keeping her at home.

## 2014-03-21 NOTE — Telephone Encounter (Signed)
I spoke w Delsa Sale at Dr. Oneita Jolly office to iniate the silverback referral form for visits for Dr. Dicie Beam office.   P: [phone]

## 2014-03-28 ENCOUNTER — Telehealth: Payer: Self-pay | Admitting: Hematology & Oncology

## 2014-03-28 NOTE — Telephone Encounter (Signed)
Kathryn Collins Nbr: [id number] Status: Approved Dates: 03/21/2014 NEW PATIENT    COPY SCANNED

## 2014-06-21 ENCOUNTER — Telehealth: Payer: Self-pay

## 2014-07-04 ENCOUNTER — Telehealth: Payer: Self-pay

## 2014-07-04 NOTE — Telephone Encounter (Signed)
I did the original several days ago. Please follow through and see if she has gotten it yet.

## 2014-07-04 NOTE — Telephone Encounter (Signed)
Kathryn Collins, [facility]) stated that "she wanted to f/u on the pt's original death certificate." LDM

## 2014-07-05 NOTE — Telephone Encounter (Signed)
[facility] service called back about this. Alyse Low- have you seen this. I haven't seen it upfront

## 2014-07-08 NOTE — Telephone Encounter (Signed)
This was put in the file back here (the fax pile) so someone grabbed the paperwork back here and took it up front. Check with Marj

## 2014-07-08 NOTE — Telephone Encounter (Signed)
The blue box?(outgoing mail)

## 2014-07-08 NOTE — Telephone Encounter (Signed)
Marj  Have you seen this.? Thanks!

## 2014-07-08 NOTE — Telephone Encounter (Signed)
Yes this was put in the box last week

## 2014-07-16 NOTE — Telephone Encounter (Signed)
Kathryn Collins with [facility] stating that patient passed away this morning and was very peaceful

## 2014-07-16 DEATH — deceased

## 2015-03-11 IMAGING — CT CT ABD-PELV W/ CM
2 of 5 series · 16 of 46 positions shown, 18 images · IV contrast (APPLIED)
Comparison: US ABDOMEN COMPLETE dated 03/02/2014; CTA-RENAL dated
11/28/2006

CLINICAL DATA: Weight loss and anorexia. Liver lesions seen on
ultrasound. History of renal cell carcinoma status post left
nephrectomy in 8555.

EXAM:
CT ABDOMEN AND PELVIS WITH CONTRAST
TECHNIQUE: Multidetector CT imaging of the abdomen and pelvis was performed
using the standard protocol following bolus administration of
intravenous contrast.
CONTRAST:  80mL OMNIPAQUE IOHEXOL 300 MG/ML  SOLN

[Series 2: abd/pelvis 5.0 b31f · axial · 0.69mm/px · z∈[-421,-96]mm · 13 of 75 slices shown, 15 images]
[im 5/75  soft-tissue]
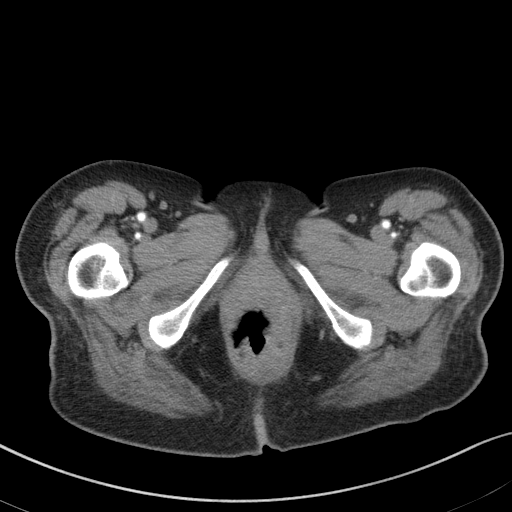
[im 5/75  bone]
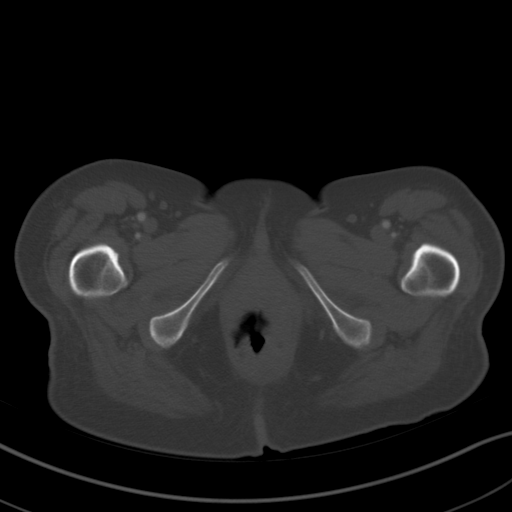
[im 9/75  soft-tissue]
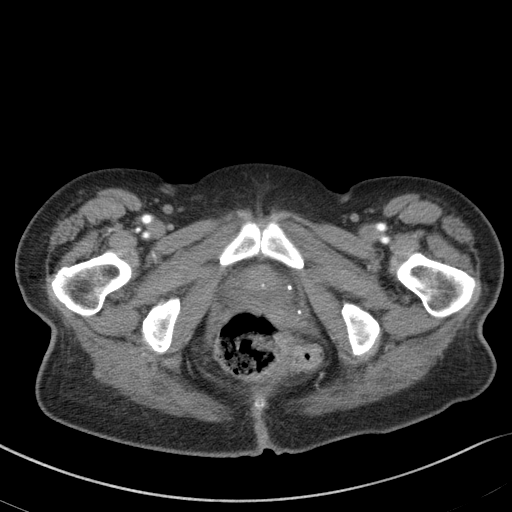
[im 18/75  soft-tissue]
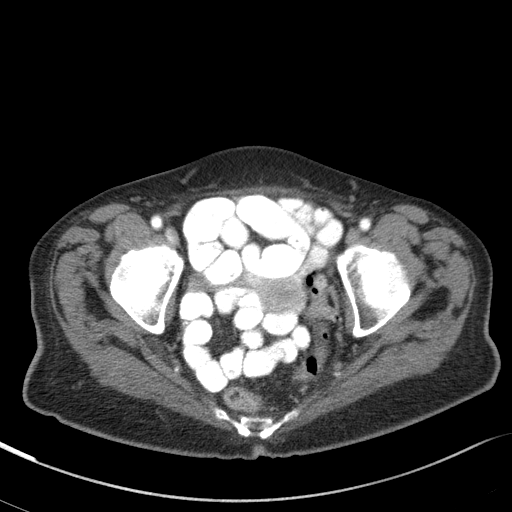
[im 22/75  soft-tissue]
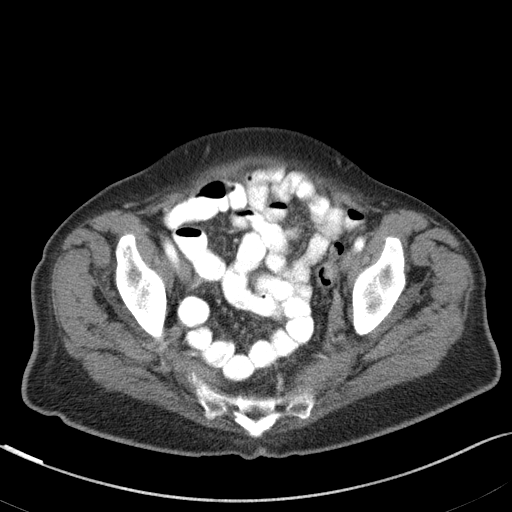
[im 27/75  soft-tissue]
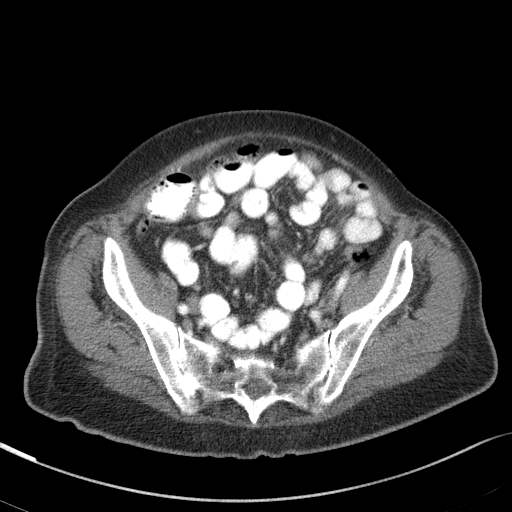
[im 31/75  soft-tissue]
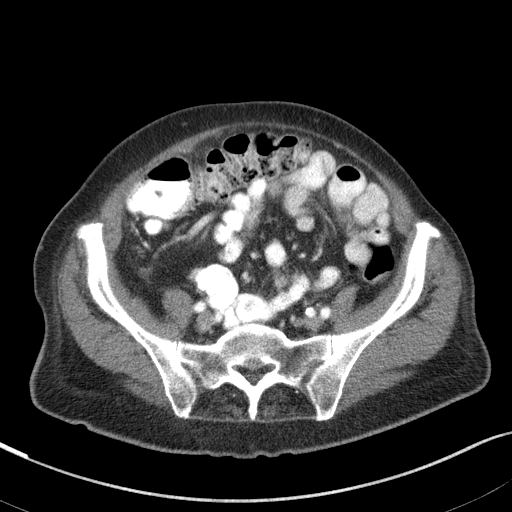
[im 40/75  soft-tissue]
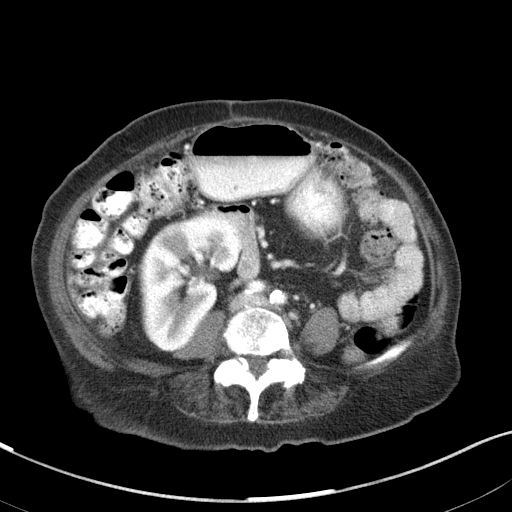
[im 44/75  soft-tissue]
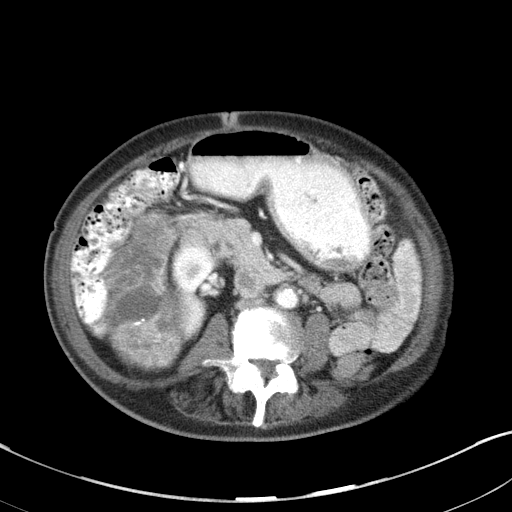
[im 48/75  soft-tissue]
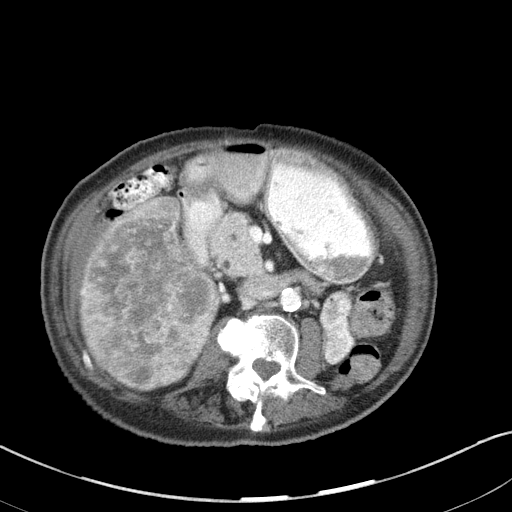
[im 48/75  bone]
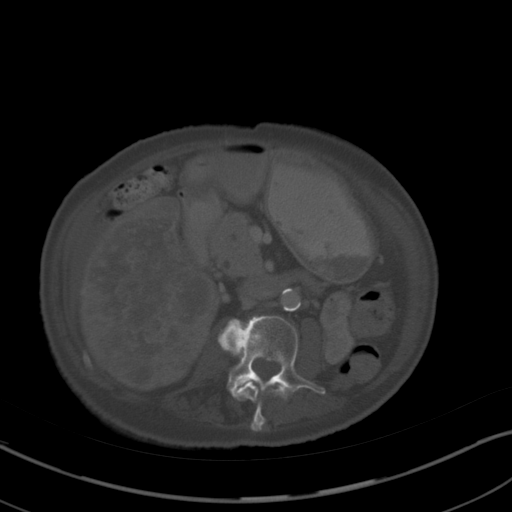
[im 53/75  soft-tissue]
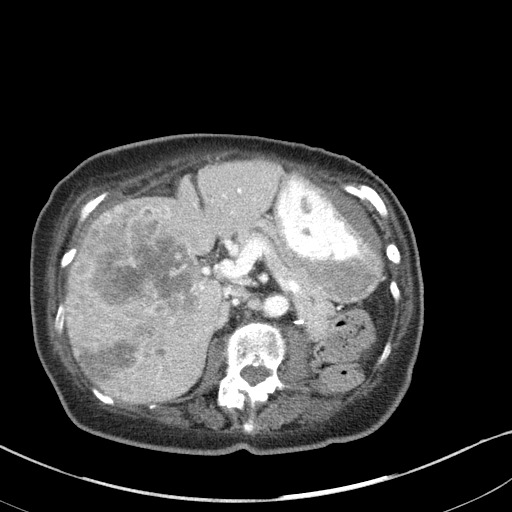
[im 57/75  soft-tissue]
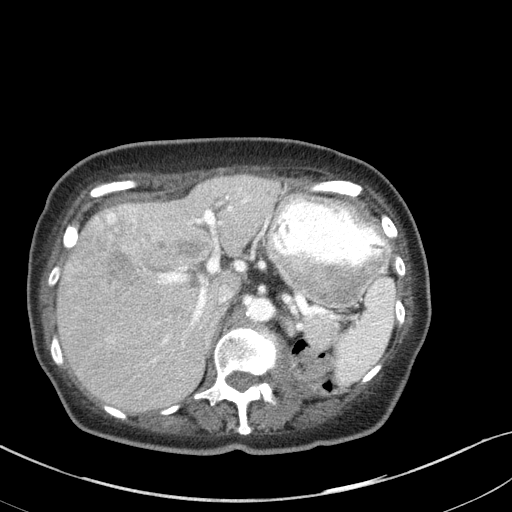
[im 66/75  soft-tissue]
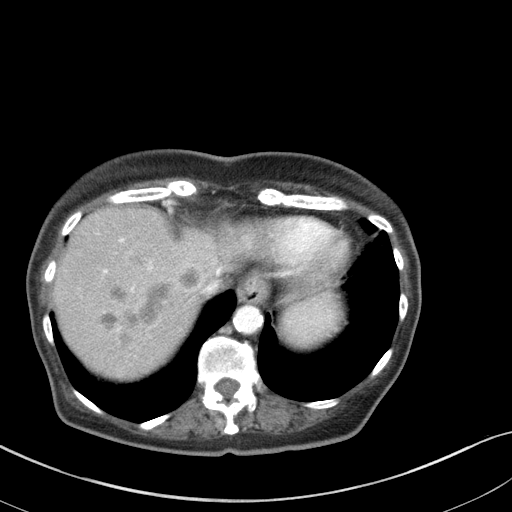
[im 70/75  soft-tissue]
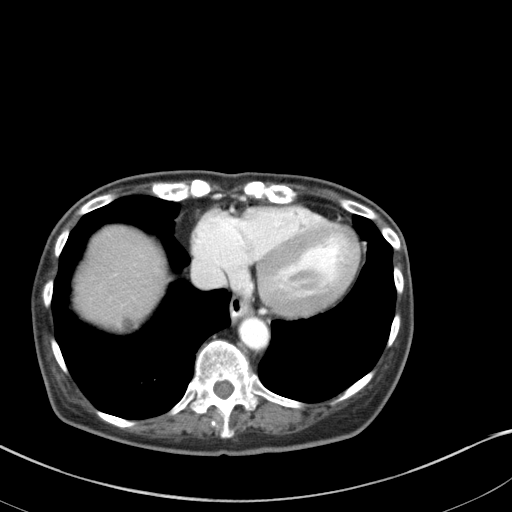

[Series 5: abd/pelvis 3.0 coronal · coronal · 0.80mm/px · 3 of 80 slices shown]
[im 27/80  soft-tissue]
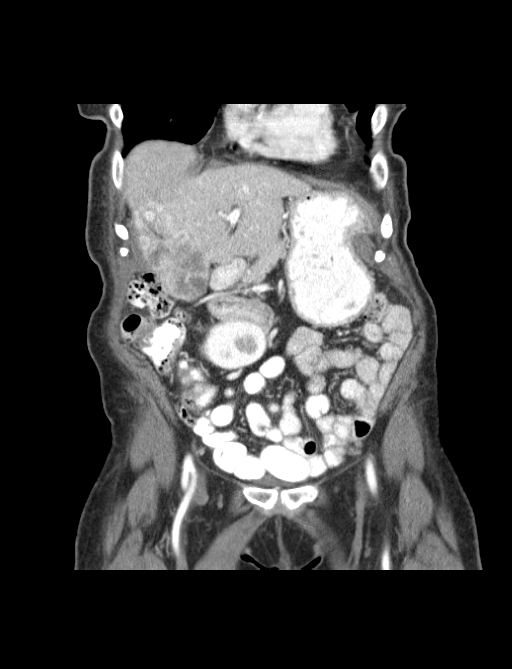
[im 36/80  soft-tissue]
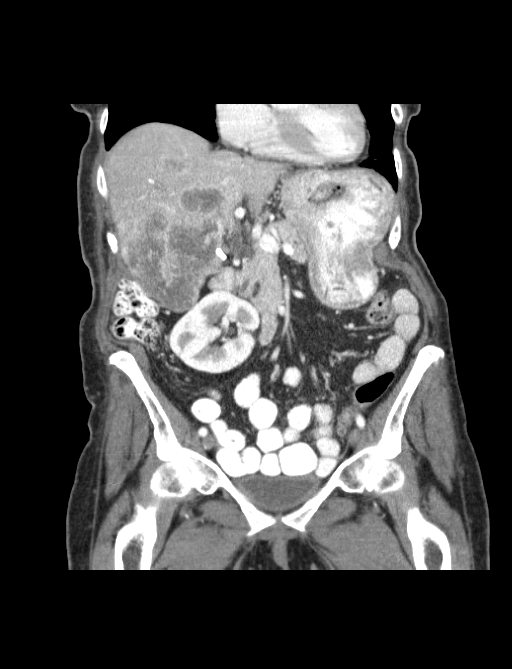
[im 44/80  soft-tissue]
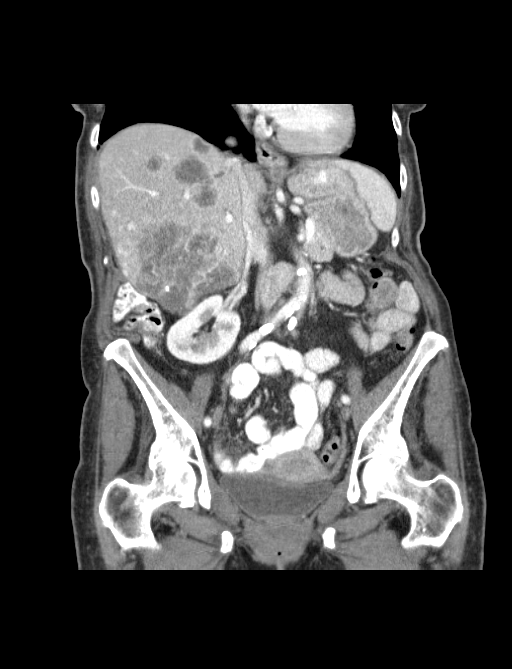

[16 of 46 positions shown; findings below may reference images not displayed]

FINDINGS: Minimal scarring or atelectasis is noted in the lung bases. No
pleural effusion.

There are numerous hypoenhancing lesions throughout both lobes of
the liver. A conglomerate of lesions in the inferior right hepatic
lobe measures approximately 7.9 x 5.0 cm (image 29), with increased
enhancement relative to the liver on delayed images. Many of the
smaller lesions demonstrate ring enhancement. 2.8 cm cystic lesion
with associated coarse calcification in segment VI is unchanged. The
gallbladder is surgically absent. Mild bilateral adrenal gland
thickening is unchanged. The left kidney is surgically absent. The
spleen is unremarkable. Mild prominence of the pancreatic duct is
unchanged. No discrete pancreatic mass is seen. Small hypodense
lesions in the right kidney measure up to 1.2 cm in size, most
likely cysts.

Oral contrast is seen in multiple nondilated loops of small bowel
and proximal colon. Moderate atherosclerotic vascular calcification
is noted. No free fluid or enlarged lymph nodes are identified.
Uterus is grossly unremarkable. No pelvic mass is seen.
Subcentimeter sclerotic foci are noted in the right pubis, left
femoral head, and posterior left ilium, most likely bone islands.
Advanced degenerative disc disease is present in the in mid and
lower lumbar spine.
IMPRESSION: Numerous lesions throughout the liver, most concerning for
metastatic disease. Primary lesion such as intrahepatic
cholangiocarcinoma may be an additional consideration. No other
evidence of metastatic disease in the remainder of the abdomen or
pelvis.
